# Patient Record
Sex: Male | Born: 1975 | Race: Black or African American | Hispanic: No | Marital: Married | Smoking: Never smoker
Health system: Southern US, Community
[De-identification: ages and names within clinical notes are randomized; demographics above are authoritative.]

## PROBLEM LIST (undated history)

## (undated) DIAGNOSIS — I10 Essential (primary) hypertension: Secondary | ICD-10-CM

## (undated) DIAGNOSIS — T7840XA Allergy, unspecified, initial encounter: Secondary | ICD-10-CM

## (undated) DIAGNOSIS — R079 Chest pain, unspecified: Secondary | ICD-10-CM

## (undated) DIAGNOSIS — F419 Anxiety disorder, unspecified: Secondary | ICD-10-CM

## (undated) DIAGNOSIS — R0789 Other chest pain: Secondary | ICD-10-CM

## (undated) HISTORY — PX: OTHER SURGICAL HISTORY: SHX169

## (undated) HISTORY — DX: Anxiety disorder, unspecified: F41.9

## (undated) HISTORY — DX: Allergy, unspecified, initial encounter: T78.40XA

## (undated) HISTORY — DX: Chest pain, unspecified: R07.9

---
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
## 1999-04-25 ENCOUNTER — Emergency Department (HOSPITAL_COMMUNITY): Admission: EM | Admit: 1999-04-25 | Discharge: 1999-04-25 | Payer: Self-pay | Admitting: Emergency Medicine

## 2002-10-27 ENCOUNTER — Emergency Department (HOSPITAL_COMMUNITY): Admission: EM | Admit: 2002-10-27 | Discharge: 2002-10-27 | Payer: Self-pay | Admitting: Emergency Medicine

## 2002-11-01 ENCOUNTER — Emergency Department (HOSPITAL_COMMUNITY): Admission: EM | Admit: 2002-11-01 | Discharge: 2002-11-01 | Payer: Self-pay | Admitting: Emergency Medicine

## 2003-01-05 ENCOUNTER — Observation Stay (HOSPITAL_COMMUNITY): Admission: EM | Admit: 2003-01-05 | Discharge: 2003-01-06 | Payer: Self-pay | Admitting: Emergency Medicine

## 2003-01-05 ENCOUNTER — Encounter: Payer: Self-pay | Admitting: Emergency Medicine

## 2005-03-03 ENCOUNTER — Emergency Department (HOSPITAL_COMMUNITY): Admission: EM | Admit: 2005-03-03 | Discharge: 2005-03-04 | Payer: Self-pay | Admitting: Emergency Medicine

## 2008-01-17 ENCOUNTER — Emergency Department (HOSPITAL_COMMUNITY): Admission: EM | Admit: 2008-01-17 | Discharge: 2008-01-17 | Payer: Self-pay | Admitting: Emergency Medicine

## 2010-08-16 ENCOUNTER — Emergency Department (HOSPITAL_COMMUNITY)
Admission: EM | Admit: 2010-08-16 | Discharge: 2010-08-17 | Disposition: A | Discharge status: Home / Self Care | Payer: BC Managed Care – PPO | Attending: Emergency Medicine | Admitting: Emergency Medicine

## 2010-08-16 DIAGNOSIS — R209 Unspecified disturbances of skin sensation: Secondary | ICD-10-CM | POA: Insufficient documentation

## 2010-08-16 DIAGNOSIS — M25519 Pain in unspecified shoulder: Secondary | ICD-10-CM | POA: Insufficient documentation

## 2010-08-16 DIAGNOSIS — R0789 Other chest pain: Secondary | ICD-10-CM | POA: Insufficient documentation

## 2010-08-16 DIAGNOSIS — I4510 Unspecified right bundle-branch block: Secondary | ICD-10-CM | POA: Insufficient documentation

## 2010-08-17 ENCOUNTER — Emergency Department (HOSPITAL_COMMUNITY): Payer: BC Managed Care – PPO

## 2010-08-17 LAB — CBC
HCT: 41 % (ref 39.0–52.0)
MCV: 84.9 fL (ref 78.0–100.0)
Platelets: 317 10*3/uL (ref 150–400)
RBC: 4.83 MIL/uL (ref 4.22–5.81)
WBC: 6.3 10*3/uL (ref 4.0–10.5)

## 2010-08-17 LAB — POCT I-STAT, CHEM 8
Chloride: 102 mEq/L (ref 96–112)
Creatinine, Ser: 1.4 mg/dL (ref 0.4–1.5)
Glucose, Bld: 100 mg/dL — ABNORMAL HIGH (ref 70–99)
HCT: 44 % (ref 39.0–52.0)
Hemoglobin: 15 g/dL (ref 13.0–17.0)
Potassium: 4.1 mEq/L (ref 3.5–5.1)
Sodium: 141 mEq/L (ref 135–145)

## 2010-08-17 LAB — POCT CARDIAC MARKERS: Troponin i, poc: 0.06 ng/mL (ref 0.00–0.09)

## 2010-08-17 LAB — DIFFERENTIAL
Eosinophils Absolute: 0.2 10*3/uL (ref 0.0–0.7)
Lymphocytes Relative: 31 % (ref 12–46)
Lymphs Abs: 2 10*3/uL (ref 0.7–4.0)
Neutrophils Relative %: 57 % (ref 43–77)

## 2010-11-11 NOTE — H&P (Signed)
NAME:  Colin Ross                        ACCOUNT NO.:  [id number]   MEDICAL RECORD NO.:  [id number]                   PATIENT TYPE:  OBV   LOCATION:  1823                                 FACILITY:  [facility]   PHYSICIAN:  Darlin Priestly, M.D.             DATE OF BIRTH:  [birth year]   DATE OF ADMISSION:  01/05/2003  DATE OF DISCHARGE:                                HISTORY & PHYSICAL   Colin Ross is a 35 year old black man who was admitted to [facility] for further evaluation of chest pain.   The patient, who has no past history of cardiac disease, presented to the  emergency department with a three-week history of intermittent chest pain.  He has experienced two to three episodes a day for the last three weeks.  Episodes occur at random and appear not to be related to physician,  activity, meals, or respirations.  The chest discomfort is described as a  substernal pressure as well as a left axillary ache.  The discomfort does  not radiate elsewhere.  It is associated with mild dyspnea but no  diaphoresis or nausea.  Episodes resolve spontaneously.  There are no  exacerbating or ameliorating factors. Of note, the chest pain does not occur  with activity including basketball and weight lifting.  There has been no  change in the frequency, intensity, or pattern of the chest pain.  The  patient is asymptomatic at this time.   As noted, the patient has no past history of cardiac disease, including no  history of chest pain, myocardial infarction, coronary artery disease,  congestive heart failure, or arrhythmia.  There is no history of  hypertension, hyperlipidemia, diabetes mellitus, smoking, or family history  of early coronary artery disease.  The patient pursues and active lifestyle  as he is a high school basketball coach.  In addition, as noted, he lifts  weights.   The patient is single and lives alone.   ALLERGIES:  He reportedly is allergic to  PENICILLIN.   MEDICATIONS:  None.   FAMILY HISTORY:  Noncontributory.   REVIEW OF SYSTEMS:  Reveals no problems related to his head, eyes, ears,  nose, mouth, throat, lungs, gastrointestinal system, genitourinary system,  or extremities.  There is no history of neurologic or psychiatric disorder.  There is no history of fever, chills, or weight loss.   PHYSICAL EXAMINATION:  VITAL SIGNS:  Blood pressure 110/58, pulse 60 and  regular, respirations 20, temperature 98.4.  GENERAL:  The patient was a tall, thin, young, white man in no discomfort.  He is alert, oriented, appropriate, and responsive.  HEENT:  Head, eyes, nose, and mouth were unremarkable.  NECK: Without thyromegaly or adenopathy.  Carotid pulses were palpable  bilaterally without bruits.  CARDIAC:  Normal S1 and S2.  There was S3, S4, murmur, rub, or click.  Cardiac rhythm is regular.  CHEST:  No chest wall tenderness  was noted.  LUNGS:  Clear.  ABDOMEN:  Soft and nontender.  There was no mass, hepatosplenomegaly, bruit,  distention, rebound, guarding, or rigidity.  Bowel sounds were normal.  RECTAL/GENITAL:  Examinations not performed as they were not pertinent to  the reason for acute care hospitalization.  EXTREMITIES:  Without edema, deviation, or deformity.  Radial and dorsalis  pedal pulses were palpable bilaterally.  NEUROLOGIC:  Rescreening neurologic survey was unremarkable.   LABORATORY DATA:  Electrocardiogram was normal.   Chest radiograph report was pending at the time of this dictation.   Initial CK 152, CK-MB 0.9, relative index 0.6, troponin less than 0.01.  White count 7.3, hemoglobin 15.9, hematocrit 45.4.  Admitting studies were  pending at the time of this dictation.   IMPRESSION:  Atypical chest pain, rule out coronary artery disease.   PLAN:  1. Telemetry.  2. Serial cardiac enzymes.  3. Aspirin.  4. Nitroglycerin paste.  5. Fasting lipid profile.  6. Cardiac testing per Dr.  Jenne Campus.     Quita Skye. Waldon Reining, MD                   Darlin Priestly, M.D.    MSC/MEDQ  D:  01/05/2003  T:  01/05/2003  Job:  [id number]

## 2010-11-11 NOTE — Discharge Summary (Signed)
NAMEBELTON, PEPLINSKI                        ACCOUNT NO.:  [id number]   MEDICAL RECORD NO.:  [id number]                   PATIENT TYPE:  OBV   LOCATION:  2033                                 FACILITY:  [facility]   PHYSICIAN:  Cristy Hilts. Jacinto Halim, M.D.                  DATE OF BIRTH:  1975-12-04   DATE OF ADMISSION:  01/05/2003  DATE OF DISCHARGE:  01/06/2003                                 DISCHARGE SUMMARY   HISTORY:  Mr. Thelonious Kauffmann is a 35 year old African-American male patient  who came into the emergency room because of episodes of chest pain. He  states that for the last three weeks he has been having intermittent chest  pain symptoms. 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  He plays basketball and also lifts weights. This occurred starting about  three weeks ago, thus he stopped his exertional activity. The chest pain is  described as a tightness in his chest radiating to his left axilla with some  slight shortness of breath symptoms. They occur at any time. They can last  for 20 minutes at a time. The episode the preceded him coming to the  emergency room started actually with an episode of light-headedness. Then he  had the chest tightness, and then he had some nausea and vomiting and he was  concerned about this and thus came to the ER. He also states that lately he  has been having a tiredness and shortness of breath with walking up stairs.  He was seen by Dr. _______, who was on call for our service, admitted with  serial cardiac enzymes. These came back negative x3. On the morning of January 06, 2003 he was seen by Dr. Jacinto Halim. He had been given also nitro paste, this  was giving him a headache, this was discontinued. Dr. Jacinto Halim felt that he  could be discharged to have an outpatient Cardiolite. The patient stated  that he also had an episode of food poisoning two to three weeks earlier and  was having some reflux problems, thus he was placed on a PPI at the  time of  discharge. Dr. Jacinto Halim had a long discussion with his parents and the patient  himself. They were upset because they were told that he may possibly need a  cardiac catheterization. The patient and the family did not want this done.  On the day of discharge his blood pressure was 140/82, his heart rate was  86, his respirations were 18, and his temperature was 98.   LABORATORY DATA:  His labs showed that his CK-MB were negative x3. Troponin  negative x3. His hemoglobin was 15.9, hematocrit was 45.4, WBCs were 7.3.  His platelets were 282. His D. dimer was 0.22. His sodium on the day of  discharge was 137, potassium 4.2, chloride 106, CO2 28. His glucose was 113,  BUN 12, creatinine 1.3.  Chest x-ray showed no active chest disease. His EKG  showed normal sinus rhythm, no acute abnormality.   DISCHARGE DIAGNOSES:  1. Chest pain, no myocardial infarction.  2. Low cardiac risk factor profile with maternal grandfather having a     myocardial infarction at age 32. Otherwise negative. No hypertension, no     tobacco use, no diabetes mellitus. Unknown cholesterol.     Lezlie Octave, N.P.                        Cristy Hilts. Jacinto Halim, M.D.    BB/MEDQ  D:  01/06/2003  T:  01/07/2003  Job:  [id number]

## 2011-03-24 LAB — DIFFERENTIAL
Basophils Absolute: 0
Basophils Relative: 0
Eosinophils Absolute: 0.2
Eosinophils Relative: 3
Lymphocytes Relative: 26
Lymphs Abs: 1.3
Monocytes Absolute: 0.2
Monocytes Relative: 5
Neutro Abs: 3.3
Neutrophils Relative %: 66

## 2011-03-24 LAB — POCT I-STAT, CHEM 8
BUN: 13
Calcium, Ion: 1 — ABNORMAL LOW
Creatinine, Ser: 1.5
Glucose, Bld: 111 — ABNORMAL HIGH
TCO2: 25

## 2011-03-24 LAB — CBC
HCT: 47.4
Hemoglobin: 15.8
MCHC: 33.3
MCV: 92.1
Platelets: 291
RBC: 5.14
RDW: 13.1
WBC: 5

## 2011-03-24 LAB — RAPID URINE DRUG SCREEN, HOSP PERFORMED
Amphetamines: NOT DETECTED
Benzodiazepines: NOT DETECTED
Tetrahydrocannabinol: NOT DETECTED

## 2014-01-23 ENCOUNTER — Other Ambulatory Visit: Payer: Self-pay | Admitting: Pediatrics

## 2014-03-12 ENCOUNTER — Ambulatory Visit (INDEPENDENT_AMBULATORY_CARE_PROVIDER_SITE_OTHER): Payer: BC Managed Care – PPO

## 2014-03-12 ENCOUNTER — Ambulatory Visit (INDEPENDENT_AMBULATORY_CARE_PROVIDER_SITE_OTHER): Payer: BC Managed Care – PPO | Admitting: Family Medicine

## 2014-03-12 ENCOUNTER — Telehealth: Payer: Self-pay | Admitting: Radiology

## 2014-03-12 VITALS — BP 108/72 | HR 86 | Temp 97.7°F | Resp 18 | Ht 76.0 in | Wt 190.8 lb

## 2014-03-12 DIAGNOSIS — R29898 Other symptoms and signs involving the musculoskeletal system: Secondary | ICD-10-CM

## 2014-03-12 DIAGNOSIS — R0602 Shortness of breath: Secondary | ICD-10-CM

## 2014-03-12 DIAGNOSIS — R209 Unspecified disturbances of skin sensation: Secondary | ICD-10-CM

## 2014-03-12 DIAGNOSIS — Z13220 Encounter for screening for lipoid disorders: Secondary | ICD-10-CM

## 2014-03-12 DIAGNOSIS — R079 Chest pain, unspecified: Secondary | ICD-10-CM

## 2014-03-12 DIAGNOSIS — R208 Other disturbances of skin sensation: Secondary | ICD-10-CM

## 2014-03-12 DIAGNOSIS — R202 Paresthesia of skin: Secondary | ICD-10-CM

## 2014-03-12 LAB — POCT CBC
GRANULOCYTE PERCENT: 61.1 % (ref 37–80)
HCT, POC: 49.3 % (ref 43.5–53.7)
Hemoglobin: 16.1 g/dL (ref 14.1–18.1)
Lymph, poc: 2 (ref 0.6–3.4)
MCH: 29.3 pg (ref 27–31.2)
MCHC: 32.6 g/dL (ref 31.8–35.4)
MCV: 89.7 fL (ref 80–97)
MID (CBC): 0.6 (ref 0–0.9)
MPV: 7.1 fL (ref 0–99.8)
PLATELET COUNT, POC: 307 10*3/uL (ref 142–424)
POC GRANULOCYTE: 4 (ref 2–6.9)
POC LYMPH PERCENT: 30.4 %L (ref 10–50)
POC MID %: 8.5 % (ref 0–12)
RBC: 5.5 M/uL (ref 4.69–6.13)
RDW, POC: 13.5 %
WBC: 6.6 10*3/uL (ref 4.6–10.2)

## 2014-03-12 LAB — D-DIMER, QUANTITATIVE (NOT AT ARMC): D DIMER QUANT: 0.27 ug{FEU}/mL (ref 0.00–0.48)

## 2014-03-12 NOTE — Telephone Encounter (Signed)
[facility] called with d dimer results of 0.27 (normal).  Dr Neva Seat informed.   Attempted to contact patient on home/mobile number listed, no answer, voicemail full.  Please try again later.

## 2014-03-12 NOTE — Progress Notes (Signed)
Subjective:    Patient ID: Colin Ross, male    DOB: 11/24/1975, 38 y.o.   MRN: [id number]  HPI Colin Ross is a 38 y.o. male Pcp:No PCP Per Patient  Presented for physical initially, but concerns on problem list to be addressed today with plan of return for scheduled physical  - not fasting today.   Chest pain, L sided - noticed a few days ago - outside of left chest - occasional squeeze sensation. Notices when still, not walking, but has not exerted himself in past few days. Also feels some heaviness in L shoulder and into upper L arm. Seems to be associated with left chest pain.  Not severe, just doesn't feel right. NKI, no new activity or exercises, no neck pain. No fever. Occasionally short of breath if carrying 81 yo son or going up stairs, but noticed that past few years. No known personal or family history of heart disease. Slight nausea- off and on for 1 week  No vomiting. No sweating.   Left calf tingling - past few days.  No calf pain, no swelling. Traveled on plane to [facility] about 1 week ago - 5 hours flight time. Has noticed similar symptoms in past, along with chest symptoms - thought to be due to anxiety. No recent stress testing  - ? Had normal stress test in 20's - unknown reason for this test. No history of blood clots. No illicit drug use.   Some increased stress with work over past month or month and a half. Denies depression.   Tx: asa - helps chest symptoms. No supplements or other medicines.   There are no active problems to display for this patient.  Past Medical History  Diagnosis Date  . Anxiety    History reviewed. No pertinent past surgical history. Allergies  Allergen Reactions  . Penicillins Rash   Prior to Admission medications   Not on File   History   Social History  . Marital Status: Married    Spouse Name: N/A    Number of Children: N/A  . Years of Education: N/A   Occupational History  . Not on file.   Social History Main  Topics  . Smoking status: Never Smoker   . Smokeless tobacco: Not on file  . Alcohol Use: No  . Drug Use: No  . Sexual Activity: Yes    Partners: Female   Other Topics Concern  . Not on file   Social History Narrative  . No narrative on file    Review of Systems  Constitutional: Negative for fever and chills.  Respiratory: Positive for shortness of breath.   Cardiovascular: Positive for chest pain. Negative for palpitations and leg swelling.  Gastrointestinal: Positive for nausea. Negative for vomiting and abdominal pain.  Musculoskeletal: Negative for joint swelling.  Skin: Negative for rash.       Objective:   Physical Exam  Vitals reviewed. Constitutional: He is oriented to person, place, and time. He appears well-developed and well-nourished.  HENT:  Head: Normocephalic and atraumatic.  Eyes: EOM are normal. Pupils are equal, round, and reactive to light.  Neck: No JVD present. Carotid bruit is not present.  Cardiovascular: Normal rate, regular rhythm, normal heart sounds and normal pulses.   No extrasystoles are present.  No murmur heard.  No systolic murmur is present  Unable to reproduce pain on exam and no reproduction of pain in arm with rom or neck rom.   Pulmonary/Chest: Effort normal and breath  sounds normal. No respiratory distress. He has no rales.  Musculoskeletal: He exhibits no edema.       Left lower leg: Normal. He exhibits no tenderness and no swelling (calf circumference equal -37cm at 15cm below patella. ).  Neurological: He is alert and oriented to person, place, and time.  Skin: Skin is warm and dry.  Psychiatric: He has a normal mood and affect. His behavior is normal.   Filed Vitals:   03/12/14 1301  BP: 108/72  Pulse: 86  Temp: 97.7 F (36.5 C)  TempSrc: Oral  Resp: 18  Height:  (1.93 m)  Weight: 190 lb 12.8 oz (86.546 kg)  SpO2: 98%    Visual Acuity Screening   Right eye Left eye Both eyes  Without correction: 20/20  With correction:      EKG: sr, early repol vs nonspecific st change anterolateral leads.  Results for orders placed in visit on 03/12/14  POCT CBC      Result Value Ref Range   WBC 6.6  4.6 - 10.2 K/uL   Lymph, poc 2.0  0.6 - 3.4   POC LYMPH PERCENT 30.4  10 - 50 %L   MID (cbc) 0.6  0 - 0.9   POC MID % 8.5  0 - 12 %M   POC Granulocyte 4.0  2 - 6.9   Granulocyte percent 61.1  37 - 80 %G   RBC 5.50  4.69 - 6.13 M/uL   Hemoglobin 16.1  14.1 - 18.1 g/dL   HCT, POC 16.1  09.6 - 53.7 %   MCV 89.7  80 - 97 fL   MCH, POC 29.3  27 - 31.2 pg   MCHC 32.6  31.8 - 35.4 g/dL   RDW, POC 04.5     Platelet Count, POC 307  142 - 424 K/uL   MPV 7.1  0 - 99.8 fL   CXR: [facility] reading (PRIMARY) by  Dr. Neva Seat: NAD, no apparent infiltrate or cardiomegaly.     Assessment & Plan:  [name] is a 38 y.o. male Chest pain, unspecified chest pain type, SOB (shortness of breath), Arm heaviness, Paresthesia/Tingling in extremity - L arm.  - Plan: D-dimer, quantitative, Ambulatory referral to Cardiology  - D dimer sent - negative at 0.27. Less likely PE.  No acute findings on EKG - early repol likely. No known cardiac risk factors, but by nature of symptoms - will have evaluated by cardiologist. Unable to reproduce sx's on cervial or shoulder exam or with chest wall palpation.  Advised to avoid exertional activities until seen by cardiology and 911/ER chest pain precautions discussed. Will check lipid panel to screen for hyperlipidemia for cardiac risk stratification.   -start ASA  qd.   Deferred physical with above concern, and not fasting.  Will plan on CPE next 1-2 months.    No orders of the defined types were placed in this encounter.   Patient Instructions  We will call you today if the blood clot test is elevated. Aspirin  each day.  Fasting lab visit only in next 4 days if possible.  Avoid exertion until evaluated by cardiologist- we will refer you there. If any increase or  worsening of chest symptoms - go to emergency room.  Follow up in next month or two for physical as discussed.  Return to the clinic or go to the nearest emergency room if any of your symptoms worsen or new symptoms occur.  Chest  Pain (Nonspecific) It is often hard to give a specific diagnosis for the cause of chest pain. There is always a chance that your pain could be related to something serious, such as a heart attack or a blood clot in the lungs. You need to follow up with your health care provider for further evaluation. CAUSES   Heartburn.  Pneumonia or bronchitis.  Anxiety or stress.  Inflammation around your heart (pericarditis) or lung (pleuritis or pleurisy).  A blood clot in the lung.  A collapsed lung (pneumothorax). It can develop suddenly on its own (spontaneous pneumothorax) or from trauma to the chest.  Shingles infection (herpes zoster virus). The chest wall is composed of bones, muscles, and cartilage. Any of these can be the source of the pain.  The bones can be bruised by injury.  The muscles or cartilage can be strained by coughing or overwork.  The cartilage can be affected by inflammation and become sore (costochondritis). DIAGNOSIS  Lab tests or other studies may be needed to find the cause of your pain. Your health care provider may have you take a test called an ambulatory electrocardiogram (ECG). An ECG records your heartbeat patterns over a 24-hour period. You may also have other tests, such as:  Transthoracic echocardiogram (TTE). During echocardiography, sound waves are used to evaluate how blood flows through your heart.  Transesophageal echocardiogram (TEE).  Cardiac monitoring. This allows your health care provider to monitor your heart rate and rhythm in real time.  Holter monitor. This is a portable device that records your heartbeat and can help diagnose heart arrhythmias. It allows your health care provider to track your heart activity for  several days, if needed.  Stress tests by exercise or by giving medicine that makes the heart beat faster. TREATMENT   Treatment depends on what may be causing your chest pain. Treatment may include:  Acid blockers for heartburn.  Anti-inflammatory medicine.  Pain medicine for inflammatory conditions.  Antibiotics if an infection is present.  You may be advised to change lifestyle habits. This includes stopping smoking and avoiding alcohol, caffeine, and chocolate.  You may be advised to keep your head raised (elevated) when sleeping. This reduces the chance of acid going backward from your stomach into your esophagus. Most of the time, nonspecific chest pain will improve within 2-3 days with rest and mild pain medicine.  HOME CARE INSTRUCTIONS   If antibiotics were prescribed, take them as directed. Finish them even if you start to feel better.  For the next few days, avoid physical activities that bring on chest pain. Continue physical activities as directed.  Do not use any tobacco products, including cigarettes, chewing tobacco, or electronic cigarettes.  Avoid drinking alcohol.  Only take medicine as directed by your health care provider.  Follow your health care provider's suggestions for further testing if your chest pain does not go away.  Keep any follow-up appointments you made. If you do not go to an appointment, you could develop lasting (chronic) problems with pain. If there is any problem keeping an appointment, call to reschedule. SEEK MEDICAL CARE IF:   Your chest pain does not go away, even after treatment.  You have a rash with blisters on your chest.  You have a fever. SEEK IMMEDIATE MEDICAL CARE IF:   You have increased chest pain or pain that spreads to your arm, neck, jaw, back, or abdomen.  You have shortness of breath.  You have an increasing cough, or you cough up  blood.  You have severe back or abdominal pain.  You feel nauseous or  vomit.  You have severe weakness.  You faint.  You have chills. This is an emergency. Do not wait to see if the pain will go away. Get medical help at once. Call your local emergency services (911 in [place] Do not drive yourself to the hospital. MAKE SURE YOU:   Understand these instructions.  Will watch your condition.  Will get help right away if you are not doing well or get worse. Document Released: 03/22/2005 Document Revised: 06/17/2013 Document Reviewed: 01/16/2008 [facility] Patient Information 2015 [place], [place]. This information is not intended to replace advice given to you by your health care provider. Make sure you discuss any questions you have with your health care provider.

## 2014-03-12 NOTE — Patient Instructions (Signed)
We will call you today if the blood clot test is elevated. Aspirin  each day.  Fasting lab visit only in next 4 days if possible.  Avoid exertion until evaluated by cardiologist- we will refer you there. If any increase or worsening of chest symptoms - go to emergency room.  Follow up in next month or two for physical as discussed.  Return to the clinic or go to the nearest emergency room if any of your symptoms worsen or new symptoms occur.  Chest Pain (Nonspecific) It is often hard to give a specific diagnosis for the cause of chest pain. There is always a chance that your pain could be related to something serious, such as a heart attack or a blood clot in the lungs. You need to follow up with your health care provider for further evaluation. CAUSES   Heartburn.  Pneumonia or bronchitis.  Anxiety or stress.  Inflammation around your heart (pericarditis) or lung (pleuritis or pleurisy).  A blood clot in the lung.  A collapsed lung (pneumothorax). It can develop suddenly on its own (spontaneous pneumothorax) or from trauma to the chest.  Shingles infection (herpes zoster virus). The chest wall is composed of bones, muscles, and cartilage. Any of these can be the source of the pain.  The bones can be bruised by injury.  The muscles or cartilage can be strained by coughing or overwork.  The cartilage can be affected by inflammation and become sore (costochondritis). DIAGNOSIS  Lab tests or other studies may be needed to find the cause of your pain. Your health care provider may have you take a test called an ambulatory electrocardiogram (ECG). An ECG records your heartbeat patterns over a 24-hour period. You may also have other tests, such as:  Transthoracic echocardiogram (TTE). During echocardiography, sound waves are used to evaluate how blood flows through your heart.  Transesophageal echocardiogram (TEE).  Cardiac monitoring. This allows your health care provider to  monitor your heart rate and rhythm in real time.  Holter monitor. This is a portable device that records your heartbeat and can help diagnose heart arrhythmias. It allows your health care provider to track your heart activity for several days, if needed.  Stress tests by exercise or by giving medicine that makes the heart beat faster. TREATMENT   Treatment depends on what may be causing your chest pain. Treatment may include:  Acid blockers for heartburn.  Anti-inflammatory medicine.  Pain medicine for inflammatory conditions.  Antibiotics if an infection is present.  You may be advised to change lifestyle habits. This includes stopping smoking and avoiding alcohol, caffeine, and chocolate.  You may be advised to keep your head raised (elevated) when sleeping. This reduces the chance of acid going backward from your stomach into your esophagus. Most of the time, nonspecific chest pain will improve within 2-3 days with rest and mild pain medicine.  HOME CARE INSTRUCTIONS   If antibiotics were prescribed, take them as directed. Finish them even if you start to feel better.  For the next few days, avoid physical activities that bring on chest pain. Continue physical activities as directed.  Do not use any tobacco products, including cigarettes, chewing tobacco, or electronic cigarettes.  Avoid drinking alcohol.  Only take medicine as directed by your health care provider.  Follow your health care provider's suggestions for further testing if your chest pain does not go away.  Keep any follow-up appointments you made. If you do not go to an appointment, you could  develop lasting (chronic) problems with pain. If there is any problem keeping an appointment, call to reschedule. SEEK MEDICAL CARE IF:   Your chest pain does not go away, even after treatment.  You have a rash with blisters on your chest.  You have a fever. SEEK IMMEDIATE MEDICAL CARE IF:   You have increased chest  pain or pain that spreads to your arm, neck, jaw, back, or abdomen.  You have shortness of breath.  You have an increasing cough, or you cough up blood.  You have severe back or abdominal pain.  You feel nauseous or vomit.  You have severe weakness.  You faint.  You have chills. This is an emergency. Do not wait to see if the pain will go away. Get medical help at once. Call your local emergency services (911 in [place] Do not drive yourself to the hospital. MAKE SURE YOU:   Understand these instructions.  Will watch your condition.  Will get help right away if you are not doing well or get worse. Document Released: 03/22/2005 Document Revised: 06/17/2013 Document Reviewed: 01/16/2008 [facility] Patient Information 2015 [place], [place]. This information is not intended to replace advice given to you by your health care provider. Make sure you discuss any questions you have with your health care provider.

## 2014-03-12 NOTE — Telephone Encounter (Signed)
Referrals- can you please

## 2014-04-16 ENCOUNTER — Encounter: Payer: Self-pay | Admitting: *Deleted

## 2014-04-20 ENCOUNTER — Encounter: Payer: Self-pay | Admitting: Family Medicine

## 2014-10-25 ENCOUNTER — Ambulatory Visit (INDEPENDENT_AMBULATORY_CARE_PROVIDER_SITE_OTHER): Payer: BC Managed Care – PPO | Admitting: Physician Assistant

## 2014-10-25 VITALS — BP 120/78 | HR 80 | Temp 97.7°F | Ht 76.8 in | Wt 199.0 lb

## 2014-10-25 DIAGNOSIS — J309 Allergic rhinitis, unspecified: Secondary | ICD-10-CM

## 2014-10-25 DIAGNOSIS — H1012 Acute atopic conjunctivitis, left eye: Secondary | ICD-10-CM | POA: Diagnosis not present

## 2014-10-25 MED ORDER — OLOPATADINE HCL 0.1 % OP SOLN
1.0000 [drp] | Freq: Two times a day (BID) | OPHTHALMIC | Status: DC
Start: 2014-10-25 — End: 2019-01-08

## 2014-10-25 MED ORDER — FLUTICASONE PROPIONATE 50 MCG/ACT NA SUSP: 2.0000 | Freq: Every day | NASAL | Status: DC

## 2014-10-25 NOTE — Patient Instructions (Signed)
Allergic Conjunctivitis  The conjunctiva is a thin membrane that covers the visible white part of the eyeball and the underside of the eyelids. This membrane protects and lubricates the eye. The membrane has small blood vessels running through it that can normally be seen. When the conjunctiva becomes inflamed, the condition is called conjunctivitis. In response to the inflammation, the conjunctival blood vessels become swollen. The swelling results in redness in the normally white part of the eye.  The blood vessels of this membrane also react when a person has allergies and is then called allergic conjunctivitis. This condition usually lasts for as long as the allergy persists. Allergic conjunctivitis cannot be passed to another person (non-contagious). The likelihood of bacterial infection is great and the cause is not likely due to allergies if the inflamed eye has:  · A sticky discharge.  · Discharge or sticking together of the lids in the morning.  · Scaling or flaking of the eyelids where the eyelashes come out.  · Red swollen eyelids.  CAUSES   · Viruses.  · Irritants such as foreign bodies.  · Chemicals.  · General allergic reactions.  · Inflammation or serious diseases in the inside or the outside of the eye or the orbit (the boney cavity in which the eye sits) can cause a "red eye."  SYMPTOMS   · Eye redness.  · Tearing.  · Itchy eyes.  · Burning feeling in the eyes.  · Clear drainage from the eye.  · Allergic reaction due to pollens or ragweed sensitivity. Seasonal allergic conjunctivitis is frequent in the spring when pollens are in the air and in the fall.  DIAGNOSIS   This condition, in its many forms, is usually diagnosed based on the history and an ophthalmological exam. It usually involves both eyes. If your eyes react at the same time every year, allergies may be the cause. While most "red eyes" are due to allergy or an infection, the role of an eye (ophthalmological) exam is important. The exam  can rule out serious diseases of the eye or orbit.  TREATMENT   · Non-antibiotic eye drops, ointments, or medications by mouth may be prescribed if the ophthalmologist is sure the conjunctivitis is due to allergies alone.  · Over-the-counter drops and ointments for allergic symptoms should be used only after other causes of conjunctivitis have been ruled out, or as your caregiver suggests.  Medications by mouth are often prescribed if other allergy-related symptoms are present. If the ophthalmologist is sure that the conjunctivitis is due to allergies alone, treatment is normally limited to drops or ointments to reduce itching and burning.  HOME CARE INSTRUCTIONS   · Wash hands before and after applying drops or ointments, or touching the inflamed eye(s) or eyelids.  · Do not let the eye dropper tip or ointment tube touch the eyelid when putting medicine in your eye.  · Stop using your soft contact lenses and throw them away. Use a new pair of lenses when recovery is complete. You should run through sterilizing cycles at least three times before use after complete recovery if the old soft contact lenses are to be used. Hard contact lenses should be stopped. They need to be thoroughly sterilized before use after recovery.  · Itching and burning eyes due to allergies is often relieved by using a cool cloth applied to closed eye(s).  SEEK MEDICAL CARE IF:   · Your problems do not go away after two or three days of treatment.  ·   Your lids are sticky (especially in the morning when you wake up) or stick together.  · Discharge develops. Antibiotics may be needed either as drops, ointment, or by mouth.  · You have extreme light sensitivity.  · An oral temperature above 102° F (38.9° C) develops.  · Pain in or around the eye or any other visual symptom develops.  MAKE SURE YOU:   · Understand these instructions.  · Will watch your condition.  · Will get help right away if you are not doing well or get worse.  Document  Released: 09/02/2002 Document Revised: 09/04/2011 Document Reviewed: 07/29/2007  ExitCare® Patient Information ©2015 [employer]. This information is not intended to replace advice given to you by your health care provider. Make sure you discuss any questions you have with your health care provider.

## 2014-10-25 NOTE — Progress Notes (Signed)
   Subjective:    Patient ID: Colin CoveDarren M Hufstedler, male    DOB: 21-Apr-1976, 39 y.o.   MRN: [id number]  HPI Patient presents for eye irritation that started last night. Eye started itching last night and he rubbed it a lot. When he woke up this morning, upper lid was swollen and eye was red. Eye lashes had a lot of white crust this morning and eyes have been watery all day. Eye is still itchy, but denies pain. Right eye was similar yesterday, but redness has improved, but is still itchy. Additionally endorses congestion, rhinorrhea, itchy nose/throat, and sneezing. Denies cough, fever, sore throat, N/V, change in vision, wheezing, SOB/CP, HA, or dizziness. H/o allergies which have been worse this season, but no asthma. Med allergy to PCN.    Review of Systems As noted above.     Objective:   Physical Exam  Constitutional: He is oriented to person, place, and time. He appears well-developed and well-nourished. No distress.  Blood pressure 120/78, pulse 80, temperature 97.7 F (36.5 C), temperature source Oral, height 6' 4.8" (1.951 m), weight 199 lb (90.266 kg), SpO2 97 %.  HENT:  Head: Normocephalic and atraumatic.  Right Ear: External ear normal.  Left Ear: External ear normal.  Mouth/Throat: Oropharynx is clear and moist. No oropharyngeal exudate.  Eyes: Pupils are equal, round, and reactive to light. Right eye exhibits discharge (watery). Left eye exhibits discharge (watery). No scleral icterus.  Neck: Normal range of motion. Neck supple. No thyromegaly present.  Pulmonary/Chest: Effort normal.  Lymphadenopathy:    He has no cervical adenopathy.  Neurological: He is alert and oriented to person, place, and time.  Skin: Skin is warm and dry. No rash noted. He is not diaphoretic. No erythema. No pallor.      Assessment & Plan:  1. Allergic conjunctivitis, left - olopatadine (PATANOL) 0.1 % ophthalmic solution; Place 1 drop into the left eye 2 (two) times daily.  Dispense: 5 mL; Refill:  1  2. Allergic rhinitis, unspecified allergic rhinitis type - fluticasone (FLONASE) 50 MCG/ACT nasal spray; Place 2 sprays into both nostrils daily.  Dispense: 16 g; Refill: 12   Sharell Hilmer PA-C  [facility] 10/25/2014 1:01 PM

## 2015-03-13 ENCOUNTER — Ambulatory Visit (INDEPENDENT_AMBULATORY_CARE_PROVIDER_SITE_OTHER): Payer: BC Managed Care – PPO | Admitting: Emergency Medicine

## 2015-03-13 VITALS — BP 100/64 | HR 66 | Temp 98.3°F | Resp 16 | Ht 76.0 in | Wt 200.6 lb

## 2015-03-13 DIAGNOSIS — Z0000 Encounter for general adult medical examination without abnormal findings: Secondary | ICD-10-CM

## 2015-03-13 NOTE — Progress Notes (Signed)

## 2015-03-13 NOTE — Progress Notes (Signed)
Subjective:  Patient ID: Colin Ross, male    DOB: May 27, 1976  Age: 39 y.o. MRN: [id number]  CC: CPE   HPI Colin Ross presents  annual physical examination and a TB skin test.  History Colin Ross has a past medical history of Anxiety; Chest pain; and Allergy.   He has no past surgical history on file.   His  family history includes Cancer in his mother; Diabetes in his father; Hypertension in his father and mother.  He   reports that he has never smoked. He does not have any smokeless tobacco history on file. He reports that he does not drink alcohol or use illicit drugs.  Outpatient Prescriptions Prior to Visit  Medication Sig Dispense Refill  . fluticasone (FLONASE) 50 MCG/ACT nasal spray Place 2 sprays into both nostrils daily. (Patient not taking: Reported on 03/13/2015) 16 g 12  . olopatadine (PATANOL) 0.1 % ophthalmic solution Place 1 drop into the left eye 2 (two) times daily. (Patient not taking: Reported on 03/13/2015) 5 mL 1   No facility-administered medications prior to visit.    Social History   Social History  . Marital Status: Married    Spouse Name: N/A  . Number of Children: N/A  . Years of Education: N/A   Social History Main Topics  . Smoking status: Never Smoker   . Smokeless tobacco: None  . Alcohol Use: No  . Drug Use: No  . Sexual Activity:    Partners: Female   Other Topics Concern  . None   Social History Narrative     Review of Systems  Constitutional: Negative for fever, chills and appetite change.  HENT: Negative for congestion, ear pain, postnasal drip, sinus pressure and sore throat.   Eyes: Negative for pain and redness.  Respiratory: Negative for cough, shortness of breath and wheezing.   Cardiovascular: Negative for leg swelling.  Gastrointestinal: Negative for nausea, vomiting, abdominal pain, diarrhea, constipation and blood in stool.  Endocrine: Negative for polyuria.  Genitourinary: Negative for dysuria, urgency,  frequency and flank pain.  Musculoskeletal: Negative for gait problem.  Skin: Negative for rash.  Neurological: Negative for weakness and headaches.  Psychiatric/Behavioral: Negative for confusion and decreased concentration. The patient is not nervous/anxious.     Objective:  BP 100/64 mmHg  Pulse 66  Temp(Src) 98.3 F (36.8 C) (Oral)  Resp 16  Ht 6\' 4"  (1.93 m)  Wt 200 lb 9.6 oz (90.992 kg)  BMI 24.43 kg/m2  SpO2 99%  Physical Exam  Constitutional: He is oriented to person, place, and time. He appears well-developed and well-nourished. No distress.  HENT:  Head: Normocephalic and atraumatic.  Right Ear: External ear normal.  Left Ear: External ear normal.  Nose: Nose normal.  Eyes: Conjunctivae and EOM are normal. Pupils are equal, round, and reactive to light. No scleral icterus.  Neck: Normal range of motion. Neck supple. No tracheal deviation present.  Cardiovascular: Normal rate, regular rhythm and normal heart sounds.   Pulmonary/Chest: Effort normal. No respiratory distress. He has no wheezes. He has no rales.  Abdominal: He exhibits no mass. There is no tenderness. There is no rebound and no guarding.  Musculoskeletal: He exhibits no edema.  Lymphadenopathy:    He has no cervical adenopathy.  Neurological: He is alert and oriented to person, place, and time. Coordination normal.  Skin: Skin is warm and dry. No rash noted.  Psychiatric: He has a normal mood and affect. His behavior is normal.  Assessment & Plan:   Knowledge was seen today for cpe.  Diagnoses and all orders for this visit:  Annual physical exam -     TB Skin Test  I am having Colin Ross maintain his olopatadine and fluticasone.  No orders of the defined types were placed in this encounter.    Appropriate red flag conditions were discussed with the patient as well as actions that should be taken.  Patient expressed his understanding.  Follow-up: Return in about 2 days (around  03/15/2015).  Carmelina Dane, MD

## 2015-03-13 NOTE — Patient Instructions (Signed)
Tuberculin Skin Test The PPD skin test is a method used to help with the diagnosis of a disease called tuberculosis (TB). HOW THE TEST IS DONE  The test site (usually the forearm) is cleansed. The PPD extract is then injected under the top layer of skin, causing a blister to form on the skin. The reaction will take 48 - 72 hours to develop. You must return to your health care Colin Ross within that time to have the area checked. This will determine whether you have had a significant reaction to the PPD test. A reaction is measured in millimeters of hard swelling (induration) at the site. PREPARATION FOR TEST  There is no special preparation for this test. People with a skin rash or other skin irritations on their arms may need to have the test performed at a different spot on the body. Tell your health care Colin Ross if you have ever had a positive PPD skin test. If so, you should not have a repeat PPD test. Tell your doctor if you have a medical condition or if you take certain drugs, such as steroids, that can affect your immune system. These situations may lead to inaccurate test results. NORMAL FINDINGS A negative reaction (no induration) or a level of hard swelling that falls below a certain cutoff may mean that a person has not been infected with the bacteria that cause TB. There are different cutoffs for children, people with HIV, and other risk groups. Unfortunately, this is not a perfect test, and up to 20% of people infected with tuberculosis may not have a reaction on the PPD skin test. In addition, certain conditions that affect the immune system (cancer, recent chemotherapy, late-stage AIDS) may cause a false-negative test result.  The reaction will take 48 - 72 hours to develop. You must return to your health care Colin Ross within that time to have the area checked. Follow your caregiver's instructions as to where and when to report for this to be done. Ranges for normal findings may vary  among different laboratories and hospitals. You should always check with your doctor after having lab work or other tests done to discuss the meaning of your test results and whether your values are considered within normal limits. WHAT ABNORMAL RESULTS MEAN  The results of the test depend on the size of the skin reaction and on the person being tested.  A small reaction (5 mm of hard swelling at the site) is considered to be positive in people who have HIV, who are taking steroid therapy, or who have been in close contact with a person who has active tuberculosis. Larger reactions (greater than or equal to 10 mm) are considered positive in people with diabetes or kidney failure, and in health care workers, among others. In people with no known risks for tuberculosis, a positive reaction requires 15 mm or more of hard swelling at the site. RISKS AND COMPLICATIONS There is a very small risk of severe redness and swelling of the arm in people who have had a previous positive PPD test and who have the test again. There also have been a few rare cases of this reaction in people who have not been tested before. CONSIDERATIONS  A positive skin test does not necessarily mean that a person has active tuberculosis. More tests will be done to check whether active disease is present. Many people who were born outside the [place] may have had a vaccine called "BCG," which can lead to a false-positive test   result. MEANING OF TEST  Your caregiver will go over the test results with you and discuss the importance and meaning of your results, as well as treatment options and the need for additional tests if necessary. OBTAINING THE TEST RESULTS It is your responsibility to obtain your test results. Ask the lab or department performing the test when and how you will get your results. Document Released: 03/22/2005 Document Revised: 09/04/2011 Document Reviewed: 09/19/2013 ExitCare Patient Information 2015  [employer]. This information is not intended to replace advice given to you by your health care Colin Ross. Make sure you discuss any questions you have with your health care Colin Ross.  

## 2015-03-15 ENCOUNTER — Ambulatory Visit (INDEPENDENT_AMBULATORY_CARE_PROVIDER_SITE_OTHER): Payer: BC Managed Care – PPO | Admitting: *Deleted

## 2015-03-15 DIAGNOSIS — Z111 Encounter for screening for respiratory tuberculosis: Secondary | ICD-10-CM

## 2015-03-15 LAB — TB SKIN TEST
Induration: 0 mm
TB Skin Test: NEGATIVE

## 2017-11-26 ENCOUNTER — Other Ambulatory Visit: Payer: Self-pay | Admitting: Gastroenterology

## 2017-11-26 DIAGNOSIS — R1084 Generalized abdominal pain: Secondary | ICD-10-CM

## 2017-11-26 DIAGNOSIS — R935 Abnormal findings on diagnostic imaging of other abdominal regions, including retroperitoneum: Secondary | ICD-10-CM

## 2017-11-28 ENCOUNTER — Other Ambulatory Visit: Payer: Self-pay

## 2017-11-28 DIAGNOSIS — R1012 Left upper quadrant pain: Secondary | ICD-10-CM

## 2017-12-07 ENCOUNTER — Ambulatory Visit
Admission: RE | Admit: 2017-12-07 | Discharge: 2017-12-07 | Disposition: A | Discharge status: Home / Self Care | Payer: BC Managed Care – PPO | Source: Ambulatory Visit | Attending: Gastroenterology | Admitting: Gastroenterology

## 2017-12-07 DIAGNOSIS — R935 Abnormal findings on diagnostic imaging of other abdominal regions, including retroperitoneum: Secondary | ICD-10-CM

## 2017-12-07 DIAGNOSIS — R1084 Generalized abdominal pain: Secondary | ICD-10-CM

## 2017-12-07 MED ORDER — IOPAMIDOL (ISOVUE-300) INJECTION 61%
125.0000 mL | Freq: Once | INTRAVENOUS | Status: AC | PRN
  Administered 2017-12-07: 125 mL via INTRAVENOUS

## 2018-12-17 ENCOUNTER — Emergency Department (HOSPITAL_BASED_OUTPATIENT_CLINIC_OR_DEPARTMENT_OTHER): Payer: BC Managed Care – PPO

## 2018-12-17 ENCOUNTER — Encounter (HOSPITAL_BASED_OUTPATIENT_CLINIC_OR_DEPARTMENT_OTHER): Payer: Self-pay | Admitting: Emergency Medicine

## 2018-12-17 ENCOUNTER — Emergency Department (HOSPITAL_BASED_OUTPATIENT_CLINIC_OR_DEPARTMENT_OTHER)
Admission: EM | Admit: 2018-12-17 | Discharge: 2018-12-17 | Disposition: A | Discharge status: Home / Self Care | Payer: BC Managed Care – PPO | Attending: Emergency Medicine | Admitting: Emergency Medicine

## 2018-12-17 ENCOUNTER — Other Ambulatory Visit: Payer: Self-pay

## 2018-12-17 DIAGNOSIS — R079 Chest pain, unspecified: Secondary | ICD-10-CM | POA: Diagnosis present

## 2018-12-17 DIAGNOSIS — R072 Precordial pain: Secondary | ICD-10-CM | POA: Diagnosis not present

## 2018-12-17 LAB — COMPREHENSIVE METABOLIC PANEL
ALT: 25 U/L (ref 0–44)
AST: 22 U/L (ref 15–41)
Albumin: 4.3 g/dL (ref 3.5–5.0)
Alkaline Phosphatase: 88 U/L (ref 38–126)
Anion gap: 9 (ref 5–15)
BUN: 13 mg/dL (ref 6–20)
CO2: 27 mmol/L (ref 22–32)
Calcium: 9.6 mg/dL (ref 8.9–10.3)
Chloride: 102 mmol/L (ref 98–111)
Creatinine, Ser: 1.26 mg/dL — ABNORMAL HIGH (ref 0.61–1.24)
GFR calc Af Amer: >60 mL/min (ref >60)
GFR calc non Af Amer: >60 mL/min (ref >60)
Glucose, Bld: 114 mg/dL — ABNORMAL HIGH (ref 70–99)
Potassium: 3.4 mmol/L — ABNORMAL LOW (ref 3.5–5.1)
Sodium: 138 mmol/L (ref 135–145)
Total Bilirubin: 0.8 mg/dL (ref 0.3–1.2)
Total Protein: 8.5 g/dL — ABNORMAL HIGH (ref 6.5–8.1)

## 2018-12-17 LAB — CBC WITH DIFFERENTIAL/PLATELET
Abs Immature Granulocytes: 0.02 10*3/uL (ref 0.00–0.07)
Basophils Absolute: 0 10*3/uL (ref 0.0–0.1)
Basophils Relative: 1 %
Eosinophils Absolute: 0.1 10*3/uL (ref 0.0–0.5)
Eosinophils Relative: 2 %
HCT: 48.5 % (ref 39.0–52.0)
Hemoglobin: 16.2 g/dL (ref 13.0–17.0)
Immature Granulocytes: 0 %
Lymphocytes Relative: 25 %
Lymphs Abs: 1.7 10*3/uL (ref 0.7–4.0)
MCH: 29.5 pg (ref 26.0–34.0)
MCHC: 33.4 g/dL (ref 30.0–36.0)
MCV: 88.2 fL (ref 80.0–100.0)
Monocytes Absolute: 0.6 10*3/uL (ref 0.1–1.0)
Monocytes Relative: 10 %
Neutro Abs: 4.3 10*3/uL (ref 1.7–7.7)
Neutrophils Relative %: 62 %
Platelets: 297 10*3/uL (ref 150–400)
RBC: 5.5 MIL/uL (ref 4.22–5.81)
RDW: 12.8 % (ref 11.5–15.5)
WBC: 6.7 10*3/uL (ref 4.0–10.5)
nRBC: 0 % (ref 0.0–0.2)

## 2018-12-17 LAB — TROPONIN I (HIGH SENSITIVITY)
Troponin I (High Sensitivity): <2 ng/L (ref <18)
Troponin I (High Sensitivity): <2 ng/L (ref <18)

## 2018-12-17 LAB — D-DIMER, QUANTITATIVE: D-Dimer, Quant: <0.27 ug/mL-FEU (ref 0.00–0.50)

## 2018-12-17 NOTE — ED Notes (Signed)
Pt on monitor 

## 2018-12-17 NOTE — Discharge Instructions (Addendum)
Make an appointment and follow-up closely with the cardiologist.  Return immediately for any worsening of your symptoms or concerns.

## 2018-12-17 NOTE — ED Notes (Signed)
Patient transported to X-ray 

## 2018-12-17 NOTE — ED Provider Notes (Signed)
[facility] Provider Note   CSN: [id number] Arrival date & time: 12/17/18  1810    History   Chief Complaint Chief Complaint  Patient presents with  . Shortness of Breath  . Chest Pain    HPI Colin Ross is a 43 y.o. male.     HPI Patient states that yesterday while playing basketball with his son he experienced central chest discomfort and shortness of breath.  States that the chest discomfort has been episodic since.  Currently claiming 1-2/10 pain.  Was seen in urgent care and had EKG performed which showed Q waves in inferior leads.  Was given 324 mg of aspirin and referred to the emergency department.  With no smoking history.  No family history of CAD.  No fevers or chills.  No cough or lower extremity swelling or pain. Past Medical History:  Diagnosis Date  . Allergy   . Anxiety   . Chest pain    negative for ischemia    There are no active problems to display for this patient.   History reviewed. No pertinent surgical history.      Home Medications    Prior to Admission medications   Medication Sig Start Date End Date Taking? Authorizing Provider  fluticasone (FLONASE) 50 MCG/ACT nasal spray Place 2 sprays into both nostrils daily. Patient not taking: Reported on 03/13/2015 10/25/14   Brewington, Tishira R, PA-C  olopatadine (PATANOL) 0.1 % ophthalmic solution Place 1 drop into the left eye 2 (two) times daily. Patient not taking: Reported on 03/13/2015 10/25/14   Brewington, Jake Seats, PA-C    Family History Family History  Problem Relation Age of Onset  . Cancer Mother   . Hypertension Mother   . Hypertension Father   . Diabetes Father     Social History Social History   Tobacco Use  . Smoking status: Never Smoker  . Smokeless tobacco: Never Used  Substance Use Topics  . Alcohol use: No    Alcohol/week: 0.0 standard drinks  . Drug use: No     Allergies   Penicillins   Review of Systems Review of  Systems  Constitutional: Negative for chills and fever.  Respiratory: Positive for shortness of breath. Negative for cough and wheezing.   Cardiovascular: Positive for chest pain. Negative for palpitations and leg swelling.  Gastrointestinal: Negative for abdominal pain, constipation, diarrhea, nausea and vomiting.  Musculoskeletal: Negative for back pain, myalgias and neck pain.  Skin: Negative for rash and wound.  Neurological: Negative for dizziness, weakness, light-headedness, numbness and headaches.  All other systems reviewed and are negative.    Physical Exam Updated Vital Signs BP 115/78   Pulse (!) 57   Temp 98.4 F (36.9 C) (Oral)   Resp 20   Ht 6\' 4"  (1.93 m)   Wt 95.3 kg   SpO2 100%   BMI 25.56 kg/m   Physical Exam Vitals signs and nursing note reviewed.  Constitutional:      General: He is not in acute distress.    Appearance: Normal appearance. He is well-developed. He is not ill-appearing.  HENT:     Head: Normocephalic and atraumatic.     Mouth/Throat:     Mouth: Mucous membranes are moist.  Eyes:     Extraocular Movements: Extraocular movements intact.     Pupils: Pupils are equal, round, and reactive to light.  Neck:     Musculoskeletal: Normal range of motion and neck supple.  Cardiovascular:  Rate and Rhythm: Normal rate and regular rhythm.     Heart sounds: No murmur. No friction rub. No gallop.   Pulmonary:     Effort: Pulmonary effort is normal. No respiratory distress.     Breath sounds: Normal breath sounds. No stridor. No wheezing, rhonchi or rales.  Chest:     Chest wall: No tenderness.  Abdominal:     General: Bowel sounds are normal.     Palpations: Abdomen is soft.     Tenderness: There is no abdominal tenderness. There is no guarding or rebound.  Musculoskeletal: Normal range of motion.        General: No swelling, tenderness, deformity or signs of injury.     Right lower leg: No edema.     Left lower leg: No edema.  Skin:     General: Skin is warm and dry.     Findings: No erythema or rash.  Neurological:     General: No focal deficit present.     Mental Status: He is alert and oriented to person, place, and time.  Psychiatric:        Behavior: Behavior normal.      ED Treatments / Results  Labs (all labs ordered are listed, but only abnormal results are displayed) Labs Reviewed  COMPREHENSIVE METABOLIC PANEL - Abnormal; Notable for the following components:      Result Value   Potassium 3.4 (*)    Glucose, Bld 114 (*)    Creatinine, Ser 1.26 (*)    Total Protein 8.5 (*)    All other components within normal limits  CBC WITH DIFFERENTIAL/PLATELET  D-DIMER, QUANTITATIVE ([facility])  TROPONIN I (HIGH SENSITIVITY)  TROPONIN I (HIGH SENSITIVITY)    EKG EKG Interpretation  Date/Time:  Tuesday December 17 2018 18:36:50 EDT Ventricular Rate:  67 PR Interval:    QRS Duration: 104 QT Interval:  375 QTC Calculation: 396 R Axis:   73 Text Interpretation:  Sinus rhythm Probable inferior infarct, old Lateral leads are also involved Confirmed by Loren RacerYelverton, Rigley Niess ([id number]) on 12/17/2018 10:00:35 PM   Radiology Dg Chest 2 View  Result Date: 12/17/2018 CLINICAL DATA:  Shortness of breath, mid to left-sided chest pain EXAM: CHEST - 2 VIEW COMPARISON:  03/12/2014 FINDINGS: The heart size and mediastinal contours are within normal limits. Both lungs are clear. The visualized skeletal structures are unremarkable. IMPRESSION: No acute abnormality of the lungs. Electronically Signed   By: Lauralyn PrimesAlex  Bibbey M.D.   On: 12/17/2018 20:04    Procedures Procedures (including critical care time)  Medications Ordered in ED Medications - No data to display   Initial Impression / Assessment and Plan / ED Course  I have reviewed the triage vital signs and the nursing notes.  Pertinent labs & imaging results that were available during my care of the patient were reviewed by me and considered in my medical decision making (see  chart for details).        EKG without significant changes from prior.  Patient had stress test several years ago which he said was normal.  No significant risk factors for MI.  High-sensitivity troponin x2 is undetectable.  Normal d-dimer.  Advised patient to follow-up with Dr. Jacinto HalimGanji and strict return precautions have been given. Final Clinical Impressions(s) / ED Diagnoses   Final diagnoses:  Precordial chest pain    ED Discharge Orders    None       Loren RacerYelverton, Dionta Larke, MD 12/17/18 2201

## 2018-12-17 NOTE — ED Triage Notes (Signed)
Pt sent here from [facility]; sts noticed [place] yest while playing basketball with son; chest "discomfort" started later on that evening; SHOB and CP intermittent since last pm

## 2019-01-08 ENCOUNTER — Encounter: Payer: Self-pay | Admitting: Cardiology

## 2019-01-10 ENCOUNTER — Encounter: Payer: Self-pay | Admitting: Cardiology

## 2019-01-10 ENCOUNTER — Other Ambulatory Visit: Payer: Self-pay

## 2019-01-10 ENCOUNTER — Ambulatory Visit (INDEPENDENT_AMBULATORY_CARE_PROVIDER_SITE_OTHER): Payer: BC Managed Care – PPO | Admitting: Cardiology

## 2019-01-10 VITALS — BP 126/82 | HR 89 | Temp 96.6°F | Ht 76.0 in | Wt 212.8 lb

## 2019-01-10 DIAGNOSIS — R9431 Abnormal electrocardiogram [ECG] [EKG]: Secondary | ICD-10-CM | POA: Diagnosis not present

## 2019-01-10 DIAGNOSIS — I209 Angina pectoris, unspecified: Secondary | ICD-10-CM

## 2019-01-10 DIAGNOSIS — R0609 Other forms of dyspnea: Secondary | ICD-10-CM

## 2019-01-10 MED ORDER — ASPIRIN EC 81 MG PO TBEC: 81.0000 mg | DELAYED_RELEASE_TABLET | Freq: Every day | ORAL | 3 refills | Status: DC

## 2019-01-10 MED ORDER — METOPROLOL SUCCINATE ER 25 MG PO TB24
25.0000 mg | ORAL_TABLET | Freq: Every day | ORAL | 1 refills | Status: DC
Start: 2019-01-10 — End: 2019-02-06

## 2019-01-10 NOTE — Progress Notes (Signed)
Primary Physician:  Patient, No Pcp Per   Patient ID: Colin Ross, male    DOB: 18-Oct-1975, 43 y.o.   MRN: [id number]  Subjective:    Chief Complaint  Patient presents with  . New Patient (Initial Visit)  . Hospitalization Follow-up  . Chest Pain    HPI: Colin Ross  is a 43 y.o. male  with no significant past medical history, presents for evaluation of chest pain.  Patient was last seen by [place] in 2015 for chest pain, underwent GXT that was normal. He recently was playing basketball with his son, and noted chest heaviness and shortness of breath. He has continued to have daily symptoms of chest heaviness that radiate to his left shoulder and back. Symptoms occur just at rest and can persist for several hours. He has not been active recently due to this. Also has dyspnea with minimal activity and increased fatigue.   He does not have a PCP. Denies any hypertension, hyperlipidemia, or diabetes. He states prior to this he would say he was in the best shape that he has been in in a long time.   No former tobacco or drug use. Occasional alcohol use.   Past Medical History:  Diagnosis Date  . Allergy   . Anxiety   . Chest pain    negative for ischemia    Past Surgical History:  Procedure Laterality Date  . none      Social History   Socioeconomic History  . Marital status: Married    Spouse name: Not on file  . Number of children: 2  . Years of education: Not on file  . Highest education level: Not on file  Occupational History  . Not on file  Social Needs  . Financial resource strain: Not on file  . Food insecurity    Worry: Not on file    Inability: Not on file  . Transportation needs    Medical: Not on file    Non-medical: Not on file  Tobacco Use  . Smoking status: Never Smoker  . Smokeless tobacco: Never Used  Substance and Sexual Activity  . Alcohol use: No    Alcohol/week: 0.0 standard drinks  . Drug use: No  . Sexual activity: Yes   Partners: Female  Lifestyle  . Physical activity    Days per week: Not on file    Minutes per session: Not on file  . Stress: Not on file  Relationships  . Social Herbalist on phone: Not on file    Gets together: Not on file    Attends religious service: Not on file    Active member of club or organization: Not on file    Attends meetings of clubs or organizations: Not on file    Relationship status: Not on file  . Intimate partner violence    Fear of current or ex partner: Not on file    Emotionally abused: Not on file    Physically abused: Not on file    Forced sexual activity: Not on file  Other Topics Concern  . Not on file  Social History Narrative  . Not on file    Review of Systems  Constitution: Positive for malaise/fatigue. Negative for decreased appetite, weight gain and weight loss.  Eyes: Negative for visual disturbance.  Cardiovascular: Positive for chest pain and dyspnea on exertion. Negative for claudication, leg swelling, orthopnea, palpitations and syncope.  Respiratory: Negative for hemoptysis and wheezing.  Endocrine: Negative for cold intolerance and heat intolerance.  Hematologic/Lymphatic: Does not bruise/bleed easily.  Skin: Negative for nail changes.  Musculoskeletal: Negative for muscle weakness and myalgias.  Gastrointestinal: Negative for abdominal pain, change in bowel habit, nausea and vomiting.  Neurological: Negative for difficulty with concentration, dizziness, focal weakness and headaches.  Psychiatric/Behavioral: Negative for altered mental status and suicidal ideas.  All other systems reviewed and are negative.     Objective:  Blood pressure 126/82, pulse 89, temperature (!) 96.6 F (35.9 C), height 6\' 4"  (1.93 m), weight 212 lb 12.8 oz (96.5 kg), SpO2 98 %. Body mass index is 25.9 kg/m.    Physical Exam  Constitutional: He is oriented to person, place, and time. Vital signs are normal. He appears well-developed and  well-nourished.  Well built  HENT:  Head: Normocephalic and atraumatic.  Neck: Normal range of motion.  Cardiovascular: Normal rate, regular rhythm, normal heart sounds and intact distal pulses.  Pulmonary/Chest: Effort normal and breath sounds normal. No accessory muscle usage. No respiratory distress.  Abdominal: Soft. Bowel sounds are normal.  Musculoskeletal: Normal range of motion.  Neurological: He is alert and oriented to person, place, and time.  Skin: Skin is warm and dry.  Vitals reviewed.  Radiology: No results found.  Laboratory examination:    CMP Latest Ref Rng & Units 12/17/2018 08/17/2010 01/17/2008  Glucose 70 - 99 mg/dL 161(W114(H) 960(A100(H) 540(J111(H)  BUN 6 - 20 mg/dL 13 12 13   Creatinine 0.61 - 1.24 mg/dL 8.11(B1.26(H) 1.4 1.5  Sodium 135 - 145 mmol/L 138 141 137  Potassium 3.5 - 5.1 mmol/L 3.4(L) 4.1 3.9  Chloride 98 - 111 mmol/L 102 102 105  CO2 22 - 32 mmol/L 27 - -  Calcium 8.9 - 10.3 mg/dL 9.6 - -  Total Protein 6.5 - 8.1 g/dL 1.4(N8.5(H) - -  Total Bilirubin 0.3 - 1.2 mg/dL 0.8 - -  Alkaline Phos 38 - 126 U/L 88 - -  AST 15 - 41 U/L 22 - -  ALT 0 - 44 U/L 25 - -   CBC Latest Ref Rng & Units 12/17/2018 03/12/2014 08/17/2010  WBC 4.0 - 10.5 K/uL 6.7 6.6 -  Hemoglobin 13.0 - 17.0 g/dL 82.916.2 56.216.1 13.015.0  Hematocrit 39.0 - 52.0 % 48.5 49.3 44.0  Platelets 150 - 400 K/uL 297 - -   Lipid Panel  No results found for: CHOL, TRIG, HDL, CHOLHDL, VLDL, LDLCALC, LDLDIRECT HEMOGLOBIN A1C No results found for: HGBA1C, MPG TSH No results for input(s): TSH in the last 8760 hours.  PRN Meds:. Medications Discontinued During This Encounter  Medication Reason  . fluticasone (FLONASE) 50 MCG/ACT nasal spray   . olopatadine (PATANOL) 0.1 % ophthalmic solution    Current Meds  Medication Sig  . acetaminophen (TYLENOL) 325 MG tablet Take 650 mg by mouth every 6 (six) hours as needed.  . vitamin C (ASCORBIC ACID) 500 MG tablet Take 500 mg by mouth daily.    Cardiac Studies:   Stress  EKG 03/27/2014: Indications: Assessment of Chest Pain. Conclusions: Negative for ischemia. Excellent effort. The patient exercised according to the Bruce protocol, Total time recorded 9 Min. 39 sec. achieving a max heart rate of 180 which was 100% of MPHR for age and 10.0 METS of work. Baseline NIBP was 118/80. Peak NIBP was 118/80 MaxSysp was: 152 MaxDiasp was: 80. The baseline ECG showed NSR,Early repolarization. During exercise there was no ST-T changes of ischemia. Symptoms: MPHR (100%) achieved. Arrhythmia: None. Continue primary prevention.  Assessment:  ICD-10-CM   1. Angina pectoris (HCC)  I20.9 EKG 12-Lead    CT COROARY FRACTIONAL FLOW RESERVE (FFR)    CT CORONARY FRACTIONAL FLOW RESERVE FLUID ANALYSIS    Lipid Profile  2. Dyspnea on exertion  R06.09 CT COROARY FRACTIONAL FLOW RESERVE (FFR)    CT CORONARY FRACTIONAL FLOW RESERVE FLUID ANALYSIS    PCV ECHOCARDIOGRAM COMPLETE  3. Abnormal EKG  R94.31    EKG 01/10/2019: Normal sinus rhythm at 84 bpm, normal axis, T wave inversion in V3. Abnormal EKG   EKG 03/18/2014: Normal sinus rhythm at a rate of 60 BPM, diffuse ST changes suggestive of early repolarization, normal axis, normal intervals, no ischemia  Recommendations:   Patient has concerning symptoms of angina. He is without history of hypertension, hyperlipidemia, or diabetes. He has had negative treadmill stress test in 2015. EKG is abnormal. Will further evaluate with coronary CTA. Will also obtain echocardiogram to exclude any structural abnormalities. I have advised him to start daily 81 mg ASA until he can have further cardiac workup. No bleeding disorders. I will also start Metoprolol succinate 25 mg daily for anti-anginal therapy. He has not had recent lipids, will obtain this for further risk stratification. I have advised him to take it easy until further testing is complete. We will see him back after the test for further recommendations.   *I have  discussed this case with Dr. Jacinto HalimGanji and he personally examined the patient and participated in formulating the plan.*   Toniann FailAshton Haynes Chadd Tollison, MSN, APRN, FNP-C [facility] [place] Office: [phone] Fax: [phone]

## 2019-01-15 ENCOUNTER — Other Ambulatory Visit: Payer: Self-pay | Admitting: Cardiology

## 2019-01-15 DIAGNOSIS — I209 Angina pectoris, unspecified: Secondary | ICD-10-CM

## 2019-01-17 ENCOUNTER — Other Ambulatory Visit: Payer: Self-pay | Admitting: Cardiology

## 2019-01-18 LAB — LIPID PANEL
Chol/HDL Ratio: 4 ratio (ref 0.0–5.0)
Cholesterol, Total: 183 mg/dL (ref 100–199)
HDL: 46 mg/dL (ref >39)
LDL Calculated: 126 mg/dL — ABNORMAL HIGH (ref 0–99)
Triglycerides: 54 mg/dL (ref 0–149)
VLDL Cholesterol Cal: 11 mg/dL (ref 5–40)

## 2019-01-21 ENCOUNTER — Telehealth: Payer: Self-pay

## 2019-01-21 ENCOUNTER — Telehealth (HOSPITAL_COMMUNITY): Payer: Self-pay | Admitting: Emergency Medicine

## 2019-01-21 NOTE — Telephone Encounter (Signed)
Reaching out to patient to offer assistance regarding upcoming cardiac imaging study; pt verbalizes understanding of appt date/time, parking situation and where to check in, pre-test NPO status and medications ordered, and verified current allergies; name and call back number provided for further questions should they arise Haven Foss RN Navigator Cardiac Imaging [facility] [phone] office [phone] cell  Pt denies covid symptoms, verbalized understanding of visitor policy. 

## 2019-01-21 NOTE — Telephone Encounter (Signed)
Reduce to 1/2 tablet and see how he does, then stop if difficult

## 2019-01-21 NOTE — Telephone Encounter (Signed)
Pt called c/o metoprolol giving him dizziness, blurred vision, ringing in R ear and pressure headaches;  He just started this medication and he wants to know can he come off  [phone]

## 2019-01-22 ENCOUNTER — Ambulatory Visit (HOSPITAL_COMMUNITY): Admission: RE | Admit: 2019-01-22 | Payer: BC Managed Care – PPO | Source: Ambulatory Visit

## 2019-01-22 ENCOUNTER — Encounter: Payer: BC Managed Care – PPO | Admitting: *Deleted

## 2019-01-22 ENCOUNTER — Ambulatory Visit (HOSPITAL_COMMUNITY)
Admission: RE | Admit: 2019-01-22 | Discharge: 2019-01-22 | Disposition: A | Discharge status: Home / Self Care | Payer: BC Managed Care – PPO | Source: Ambulatory Visit | Attending: Cardiology | Admitting: Cardiology

## 2019-01-22 ENCOUNTER — Other Ambulatory Visit: Payer: Self-pay

## 2019-01-22 VITALS — Temp 97.1°F

## 2019-01-22 DIAGNOSIS — I209 Angina pectoris, unspecified: Secondary | ICD-10-CM | POA: Insufficient documentation

## 2019-01-22 DIAGNOSIS — Z006 Encounter for examination for normal comparison and control in clinical research program: Secondary | ICD-10-CM

## 2019-01-22 MED ORDER — IOHEXOL 350 MG/ML SOLN
80.0000 mL | Freq: Once | INTRAVENOUS | Status: AC | PRN
  Administered 2019-01-22: 80 mL via INTRAVENOUS

## 2019-01-22 MED ORDER — NITROGLYCERIN 0.4 MG SL SUBL
SUBLINGUAL_TABLET | SUBLINGUAL | Status: AC
  Filled 2019-01-22: qty 2

## 2019-01-22 MED ORDER — METOPROLOL TARTRATE 5 MG/5ML IV SOLN
5.0000 mg | INTRAVENOUS | Status: DC | PRN
  Filled 2019-01-22: qty 5

## 2019-01-22 MED ORDER — NITROGLYCERIN 0.4 MG SL SUBL
0.8000 mg | SUBLINGUAL_TABLET | SUBLINGUAL | Status: DC | PRN
  Administered 2019-01-22: 0.8 mg via SUBLINGUAL
  Filled 2019-01-22 (×2): qty 25

## 2019-01-22 NOTE — Research (Signed)
Subject Name: Colin Ross  Subject met inclusion and exclusion criteria.  The informed consent form, study requirements and expectations were reviewed with the subject and questions and concerns were addressed prior to the signing of the consent form.  The subject verbalized understanding of the trial requirements.  The subject agreed to participate in the CADFEM G4 trial and signed the informed consent on07/29/20.  The informed consent was obtained prior to performance of any protocol-specific procedures for the subject.  A copy of the signed informed consent was given to the subject and a copy was placed in the subject's medical record.   [facility] [employer] [place]

## 2019-01-27 ENCOUNTER — Other Ambulatory Visit: Payer: Self-pay

## 2019-01-27 ENCOUNTER — Ambulatory Visit (INDEPENDENT_AMBULATORY_CARE_PROVIDER_SITE_OTHER): Payer: BC Managed Care – PPO

## 2019-01-27 DIAGNOSIS — R0609 Other forms of dyspnea: Secondary | ICD-10-CM | POA: Diagnosis not present

## 2019-02-06 ENCOUNTER — Encounter: Payer: Self-pay | Admitting: Cardiology

## 2019-02-06 DIAGNOSIS — R0789 Other chest pain: Secondary | ICD-10-CM | POA: Insufficient documentation

## 2019-02-06 DIAGNOSIS — I10 Essential (primary) hypertension: Secondary | ICD-10-CM

## 2019-02-06 HISTORY — DX: Essential (primary) hypertension: I10

## 2019-02-06 HISTORY — DX: Other chest pain: R07.89

## 2019-02-07 ENCOUNTER — Ambulatory Visit (INDEPENDENT_AMBULATORY_CARE_PROVIDER_SITE_OTHER): Payer: BC Managed Care – PPO | Admitting: Cardiology

## 2019-02-07 ENCOUNTER — Encounter: Payer: Self-pay | Admitting: Cardiology

## 2019-02-07 ENCOUNTER — Other Ambulatory Visit: Payer: Self-pay

## 2019-02-07 VITALS — BP 128/81 | HR 83 | Temp 97.0°F | Ht 76.0 in | Wt 212.0 lb

## 2019-02-07 DIAGNOSIS — R0789 Other chest pain: Secondary | ICD-10-CM | POA: Diagnosis not present

## 2019-02-07 DIAGNOSIS — H9319 Tinnitus, unspecified ear: Secondary | ICD-10-CM | POA: Diagnosis not present

## 2019-02-07 NOTE — Progress Notes (Signed)
Primary Physician:  Patient, No Pcp Per   Patient ID: Colin Ross, male    DOB: 1975-12-03, 43 y.o.   MRN: [id number]  Subjective:    Chief Complaint  Patient presents with  . Chest Pain    test results  . Follow-up    HPI: Colin Ross  is a 43 y.o. male  with no significant past medical history, presents for evaluation of chest pain.  Recently seen for chest heaviness, fatigue and shortness of breath. Symptoms felt to be atypical; however, had some concerning features. He underwent coronary CTA and echocardiogram and now presents for follow up.   He was started on ASA and Metoprolol at his last visit. He developed persistent ringing in his ears 2 days after starting the Metoprolol. He has since stopped the medication as of 1 week ago, but has continued to have almost persistent ringing.   He does not have a PCP. Denies any hypertension, hyperlipidemia, or diabetes. He states prior to this he would say he was in the best shape that he has been in in a long time.   No former tobacco or drug use. Occasional alcohol use.   Past Medical History:  Diagnosis Date  . Allergy   . Anxiety   . Atypical chest pain 02/06/2019  . Chest pain    negative for ischemia  . HTN (hypertension) 02/06/2019    Past Surgical History:  Procedure Laterality Date  . none      Social History   Socioeconomic History  . Marital status: Married    Spouse name: Not on file  . Number of children: 2  . Years of education: Not on file  . Highest education level: Not on file  Occupational History  . Not on file  Social Needs  . Financial resource strain: Not on file  . Food insecurity    Worry: Not on file    Inability: Not on file  . Transportation needs    Medical: Not on file    Non-medical: Not on file  Tobacco Use  . Smoking status: Never Smoker  . Smokeless tobacco: Never Used  Substance and Sexual Activity  . Alcohol use: No    Alcohol/week: 0.0 standard drinks  . Drug  use: No  . Sexual activity: Yes    Partners: Female  Lifestyle  . Physical activity    Days per week: Not on file    Minutes per session: Not on file  . Stress: Not on file  Relationships  . Social Musicianconnections    Talks on phone: Not on file    Gets together: Not on file    Attends religious service: Not on file    Active member of club or organization: Not on file    Attends meetings of clubs or organizations: Not on file    Relationship status: Not on file  . Intimate partner violence    Fear of current or ex partner: Not on file    Emotionally abused: Not on file    Physically abused: Not on file    Forced sexual activity: Not on file  Other Topics Concern  . Not on file  Social History Narrative  . Not on file    Review of Systems  Constitution: Negative for decreased appetite, malaise/fatigue, weight gain and weight loss.  Eyes: Negative for visual disturbance.  Cardiovascular: Negative for chest pain, claudication, dyspnea on exertion, leg swelling, orthopnea, palpitations and syncope.  Respiratory: Negative for hemoptysis  and wheezing.   Endocrine: Negative for cold intolerance and heat intolerance.  Hematologic/Lymphatic: Does not bruise/bleed easily.  Skin: Negative for nail changes.  Musculoskeletal: Negative for muscle weakness and myalgias.  Gastrointestinal: Negative for abdominal pain, change in bowel habit, nausea and vomiting.  Neurological: Negative for difficulty with concentration, dizziness, focal weakness and headaches.  Psychiatric/Behavioral: Negative for altered mental status and suicidal ideas.  All other systems reviewed and are negative.     Objective:  Blood pressure 128/81, pulse 83, temperature (!) 97 F (36.1 C), height 6\' 4"  (1.93 m), weight 212 lb (96.2 kg), SpO2 100 %. Body mass index is 25.81 kg/m.    Physical Exam  Constitutional: He is oriented to person, place, and time. Vital signs are normal. He appears well-developed and  well-nourished.  Well built  HENT:  Head: Normocephalic and atraumatic.  Neck: Normal range of motion.  Cardiovascular: Normal rate, regular rhythm, normal heart sounds and intact distal pulses.  Pulmonary/Chest: Effort normal and breath sounds normal. No accessory muscle usage. No respiratory distress.  Abdominal: Soft. Bowel sounds are normal.  Musculoskeletal: Normal range of motion.  Neurological: He is alert and oriented to person, place, and time.  Skin: Skin is warm and dry.  Vitals reviewed.  Radiology: No results found.  Laboratory examination:    CMP Latest Ref Rng & Units 12/17/2018 08/17/2010 01/17/2008  Glucose 70 - 99 mg/dL 161(W114(H) 960(A100(H) 540(J111(H)  BUN 6 - 20 mg/dL 13 12 13   Creatinine 0.61 - 1.24 mg/dL 8.11(B1.26(H) 1.4 1.5  Sodium 135 - 145 mmol/L 138 141 137  Potassium 3.5 - 5.1 mmol/L 3.4(L) 4.1 3.9  Chloride 98 - 111 mmol/L 102 102 105  CO2 22 - 32 mmol/L 27 - -  Calcium 8.9 - 10.3 mg/dL 9.6 - -  Total Protein 6.5 - 8.1 g/dL 1.4(N8.5(H) - -  Total Bilirubin 0.3 - 1.2 mg/dL 0.8 - -  Alkaline Phos 38 - 126 U/L 88 - -  AST 15 - 41 U/L 22 - -  ALT 0 - 44 U/L 25 - -   CBC Latest Ref Rng & Units 12/17/2018 03/12/2014 08/17/2010  WBC 4.0 - 10.5 K/uL 6.7 6.6 -  Hemoglobin 13.0 - 17.0 g/dL 82.916.2 56.216.1 13.015.0  Hematocrit 39.0 - 52.0 % 48.5 49.3 44.0  Platelets 150 - 400 K/uL 297 - -   Lipid Panel     Component Value Date/Time   CHOL 183 01/17/2019 0841   TRIG 54 01/17/2019 0841   HDL 46 01/17/2019 0841   CHOLHDL 4.0 01/17/2019 0841   LDLCALC 126 (H) 01/17/2019 0841   HEMOGLOBIN A1C No results found for: HGBA1C, MPG TSH No results for input(s): TSH in the last 8760 hours.  PRN Meds:. Medications Discontinued During This Encounter  Medication Reason  . metoprolol succinate (TOPROL-XL) 25 MG 24 hr tablet   . aspirin EC 81 MG tablet    Current Meds  Medication Sig  . acetaminophen (TYLENOL) 325 MG tablet Take 650 mg by mouth every 6 (six) hours as needed.  Marland Kitchen. aspirin EC 81  MG tablet Take 81 mg by mouth daily as needed.  . cholecalciferol (VITAMIN D3) 25 MCG (1000 UT) tablet Take 1,000 Units by mouth daily.  . vitamin C (ASCORBIC ACID) 500 MG tablet Take 500 mg by mouth daily.  . Zinc Sulfate (ZINC 15 PO) Take 1 capsule by mouth daily.    Cardiac Studies:   Coronary CTA 01/22/2019:  1. Coronary calcium score of 0. This was 0  percentile for age and sex matched control.  2. Normal coronary origin with right dominance.  3. No evidence of CAD.  Echocardiogram 01/27/2019: Normal LV systolic function with EF 55%. Left ventricle cavity is normal in size. Mild concentric hypertrophy of the left ventricle. Normal global wall motion. Normal diastolic filling pattern. Calculated EF 55%. Mild to moderate mitral regurgitation. Inadequate TR jet to estimate pulmonary artery systolic pressure. Normal right atrial pressure.   Stress EKG 03/27/2014: Indications: Assessment of Chest Pain. Conclusions: Negative for ischemia. Excellent effort. The patient exercised according to the Bruce protocol, Total time recorded 9 Min. 39 sec. achieving a max heart rate of 180 which was 100% of MPHR for age and 10.0 METS of work. Baseline NIBP was 118/80. Peak NIBP was 118/80 MaxSysp was: 152 MaxDiasp was: 80. The baseline ECG showed NSR,Early repolarization. During exercise there was no ST-T changes of ischemia. Symptoms: MPHR (100%) achieved. Arrhythmia: None. Continue primary prevention.  Assessment:     ICD-10-CM   1. Atypical chest pain  R07.89   2. Essential hypertension  I10    EKG 01/10/2019: Normal sinus rhythm at 84 bpm, normal axis, T wave inversion in V3. Abnormal EKG   EKG 03/18/2014: Normal sinus rhythm at a rate of 60 BPM, diffuse ST changes suggestive of early repolarization, normal axis, normal intervals, no ischemia  Recommendations:   I have discussed recently obtained Coronary CTA and echocardiogram results with the patient, normal coronary  arteries by CT scan. Echocardiogram was also essentially normal with mild LVH likely related to athlete. His symptoms of atypical chest pain have resolved since last seen by [place]. I have asked him to stop ASA therapy. He was placed on metoprolol at his last visit for cardiac protection given his symptoms, but unfortunately, he developed persistent tinnitus 2 days after starting the medication. He has since stopped the medication as of 1 week ago. Hopefully this will resolve with more time. I have asked him to notify me if it does not improve in the next 2 weeks and if not, will refer to ENT for evaluation.    Miquel Dunn, MSN, APRN, FNP-C [facility] [place] Office: [phone] Fax: [phone]

## 2019-09-08 IMAGING — CT CT HEAR MORPH WITH CTA COR WITH SCORE WITH CA WITH CONTRAST AND
4 of 7 series · 8 of 20 positions shown, 9 images · IV contrast (APPLIED)
Comparison: None.
COMPARISON: None.

Addendum:
EXAM:
OVER-READ INTERPRETATION  CT CHEST

The following report is an over-read performed by radiologist Dr.
Lorenz Jumper [REDACTED] on 01/22/2019. This
over-read does not include interpretation of cardiac or coronary
anatomy or pathology. The coronary calcium score/coronary CTA
interpretation by the cardiologist is attached.
TECHNIQUE: The patient was scanned on a Phillips Force scanner.

[Series 6: best diast 74 % · axial · 0.39mm/px · z∈[+1222,+1272]mm · 2 of 378 slices shown, 3 images]
[im 126/378  vessel]
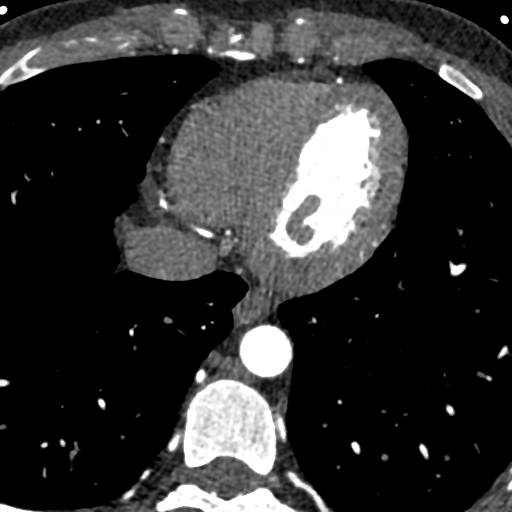
[im 126/378  lung]
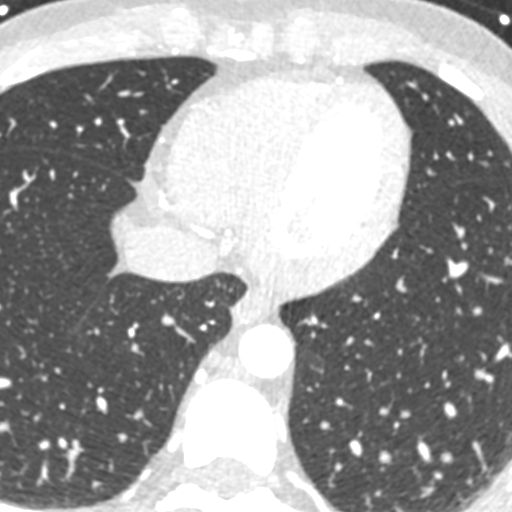
[im 252/378  vessel]
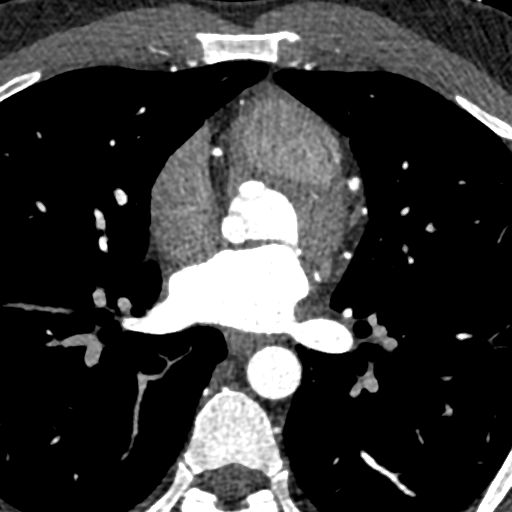

[Series 7: best syst 35 % · axial · 0.39mm/px · z∈[+1222,+1272]mm · 2 of 378 slices shown]
[im 126/378  vessel]
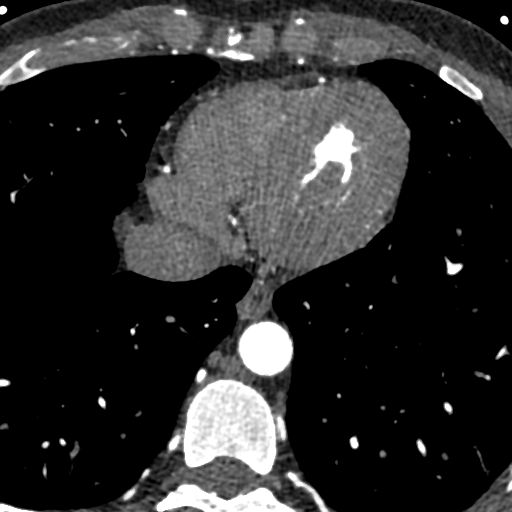
[im 252/378  vessel]
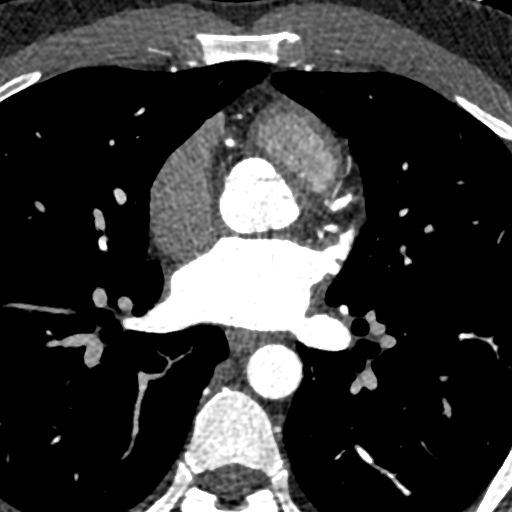

[Series 8: ts diast sharp 74 % · axial · 0.39mm/px · z∈[+1222,+1272]mm · 2 of 378 slices shown]
[im 126/378  lung]
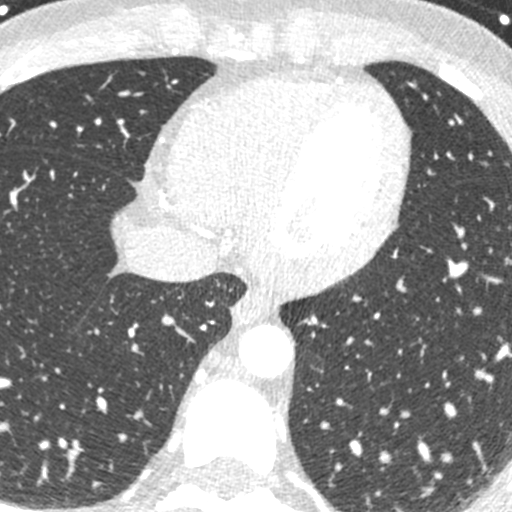
[im 252/378  lung]
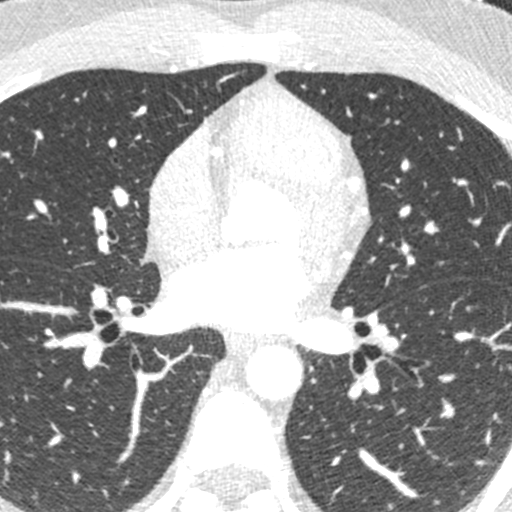

[Series 9: ts syst sharp 35 % · axial · 0.39mm/px · z∈[+1222,+1272]mm · 2 of 378 slices shown]
[im 126/378  lung]
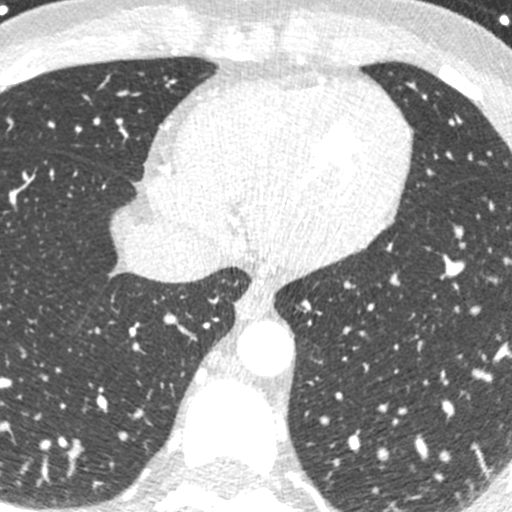
[im 252/378  lung]
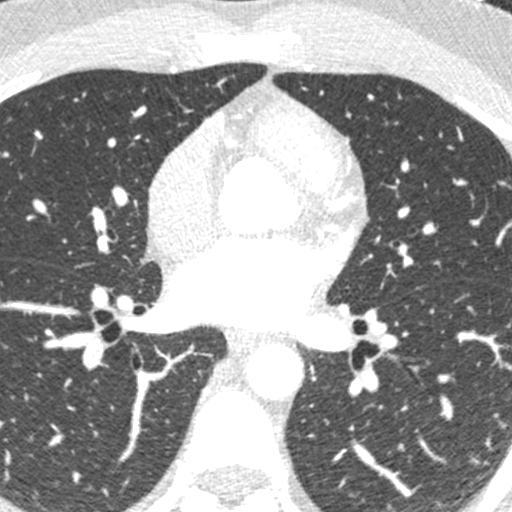

[8 of 20 positions shown; findings below may reference images not displayed]

FINDINGS: Vascular: Negative.

Mediastinum/Nodes: Negative.

Lungs/Pleura: Negative.

Upper Abdomen: Negative.

Musculoskeletal: Negative.
IMPRESSION: Extracardiac structures are unremarkable.

EXAM:
Cardiac/Coronary  CT
FINDINGS: A 120 kV prospective scan was triggered in the descending thoracic
aorta at 111 HU's. Axial non-contrast 3 mm slices were carried out
through the heart. The data set was analyzed on a dedicated work
station and scored using the Agatson method. Gantry rotation speed
was 250 msecs and collimation was .6 mm. No beta blockade and 0.8 mg
of sl NTG was given. The 3D data set was reconstructed in 5%
intervals of the 67-82 % of the R-R cycle. Diastolic phases were
analyzed on a dedicated work station using MPR, MIP and VRT modes.
The patient received 80 cc of contrast.

Aorta:  Normal size.  No calcifications.  No dissection.

Aortic Valve:  Trileaflet.  No calcifications.

Coronary Arteries:  Normal coronary origin.  Right dominance.

RCA is a large dominant artery that gives rise to 2 small acute RVM
branches, PDA and PLVB. There is no plaque.

Left main is a large artery that gives rise to LAD and LCX arteries.
There is no plaque.

LAD is a large vessel that gives rise to a large branching diagonal.
There is no plaque.

LCX is a non-dominant artery that gives rise to one large
trifurcating OM1 branch. There is no plaque in the proximal and mid
OM1 branch and the distal branches are patent but cannot assess for
plaque due to small caliber.

Other findings:

Normal pulmonary vein drainage into the left atrium.

Normal let atrial appendage without a thrombus.

Normal size of the pulmonary artery.
IMPRESSION: 1. Coronary calcium score of 0. This was 0 percentile for age and
sex matched control.

2. Normal coronary origin with right dominance.

3. No evidence of CAD.

Radovancevic Danijel

*** End of Addendum ***
EXAM:
OVER-READ INTERPRETATION  CT CHEST

The following report is an over-read performed by radiologist Dr.
Lorenz Jumper [[email or web address]] on 01/22/2019. This
over-read does not include interpretation of cardiac or coronary
anatomy or pathology. The coronary calcium score/coronary CTA
interpretation by the cardiologist is attached.
FINDINGS: Vascular: Negative.

Mediastinum/Nodes: Negative.

Lungs/Pleura: Negative.

Upper Abdomen: Negative.

Musculoskeletal: Negative.
IMPRESSION: Extracardiac structures are unremarkable.

## 2022-10-28 ENCOUNTER — Encounter (HOSPITAL_BASED_OUTPATIENT_CLINIC_OR_DEPARTMENT_OTHER): Payer: Self-pay | Admitting: Emergency Medicine

## 2022-10-28 ENCOUNTER — Emergency Department (HOSPITAL_BASED_OUTPATIENT_CLINIC_OR_DEPARTMENT_OTHER)
Admission: EM | Admit: 2022-10-28 | Discharge: 2022-10-29 | Disposition: A | Discharge status: Home / Self Care | Payer: Self-pay | Attending: Emergency Medicine | Admitting: Emergency Medicine

## 2022-10-28 ENCOUNTER — Emergency Department (HOSPITAL_BASED_OUTPATIENT_CLINIC_OR_DEPARTMENT_OTHER): Payer: Self-pay

## 2022-10-28 ENCOUNTER — Other Ambulatory Visit: Payer: Self-pay

## 2022-10-28 DIAGNOSIS — N23 Unspecified renal colic: Secondary | ICD-10-CM | POA: Insufficient documentation

## 2022-10-28 LAB — COMPREHENSIVE METABOLIC PANEL
ALT: 29 U/L (ref 0–44)
AST: 21 U/L (ref 15–41)
Albumin: 4.2 g/dL (ref 3.5–5.0)
Alkaline Phosphatase: 92 U/L (ref 38–126)
Anion gap: 9 (ref 5–15)
BUN: 17 mg/dL (ref 6–20)
CO2: 24 mmol/L (ref 22–32)
Calcium: 8.8 mg/dL — ABNORMAL LOW (ref 8.9–10.3)
Chloride: 102 mmol/L (ref 98–111)
Creatinine, Ser: 1.34 mg/dL — ABNORMAL HIGH (ref 0.61–1.24)
GFR, Estimated: >60 mL/min (ref >60)
Glucose, Bld: 104 mg/dL — ABNORMAL HIGH (ref 70–99)
Potassium: 3.7 mmol/L (ref 3.5–5.1)
Sodium: 135 mmol/L (ref 135–145)
Total Bilirubin: 1.1 mg/dL (ref 0.3–1.2)
Total Protein: 7.9 g/dL (ref 6.5–8.1)

## 2022-10-28 LAB — URINALYSIS, MICROSCOPIC (REFLEX)

## 2022-10-28 LAB — URINALYSIS, ROUTINE W REFLEX MICROSCOPIC
Bilirubin Urine: NEGATIVE
Glucose, UA: NEGATIVE mg/dL
Ketones, ur: NEGATIVE mg/dL
Leukocytes,Ua: NEGATIVE
Nitrite: NEGATIVE
Protein, ur: NEGATIVE mg/dL
Specific Gravity, Urine: 1.02 (ref 1.005–1.030)
pH: 5.5 (ref 5.0–8.0)

## 2022-10-28 LAB — CBC WITH DIFFERENTIAL/PLATELET
Abs Immature Granulocytes: 0.04 10*3/uL (ref 0.00–0.07)
Basophils Absolute: 0 10*3/uL (ref 0.0–0.1)
Basophils Relative: 0 %
Eosinophils Absolute: 0.2 10*3/uL (ref 0.0–0.5)
Eosinophils Relative: 2 %
HCT: 42.3 % (ref 39.0–52.0)
Hemoglobin: 14.7 g/dL (ref 13.0–17.0)
Immature Granulocytes: 0 %
Lymphocytes Relative: 29 %
Lymphs Abs: 2.6 10*3/uL (ref 0.7–4.0)
MCH: 29.8 pg (ref 26.0–34.0)
MCHC: 34.8 g/dL (ref 30.0–36.0)
MCV: 85.6 fL (ref 80.0–100.0)
Monocytes Absolute: 0.9 10*3/uL (ref 0.1–1.0)
Monocytes Relative: 10 %
Neutro Abs: 5.4 10*3/uL (ref 1.7–7.7)
Neutrophils Relative %: 59 %
Platelets: 292 10*3/uL (ref 150–400)
RBC: 4.94 MIL/uL (ref 4.22–5.81)
RDW: 12.8 % (ref 11.5–15.5)
WBC: 9.1 10*3/uL (ref 4.0–10.5)
nRBC: 0 % (ref 0.0–0.2)

## 2022-10-28 LAB — D-DIMER, QUANTITATIVE: D-Dimer, Quant: 0.29 ug/mL-FEU (ref 0.00–0.50)

## 2022-10-28 LAB — TROPONIN I (HIGH SENSITIVITY): Troponin I (High Sensitivity): 2 ng/L (ref <18)

## 2022-10-28 MED ORDER — IBUPROFEN 800 MG PO TABS: 800.0000 mg | ORAL_TABLET | Freq: Three times a day (TID) | ORAL | 0 refills | Status: DC

## 2022-10-28 MED ORDER — SODIUM CHLORIDE 0.9 % IV BOLUS
1000.0000 mL | Freq: Once | INTRAVENOUS | Status: AC
  Administered 2022-10-28: 1000 mL via INTRAVENOUS

## 2022-10-28 MED ORDER — KETOROLAC TROMETHAMINE 30 MG/ML IJ SOLN
30.0000 mg | Freq: Once | INTRAMUSCULAR | Status: AC
  Administered 2022-10-28: 30 mg via INTRAVENOUS
  Filled 2022-10-28: qty 1

## 2022-10-28 NOTE — ED Provider Notes (Signed)
EMERGENCY DEPARTMENT AT [facility] Provider Note   CSN: [id number] Arrival date & time: 10/28/22  2143     History  Chief Complaint  Patient presents with   Flank Pain    Colin Ross is a 47 y.o. male here presenting with flank pain.  Patient states that about 30 minutes prior to arrival, he has acute onset of flank pain.  It started on the right then became on the left.  He states it is sharp in nature and 6 out of 10 pain.  Patient denies any heavy lifting.  Patient states that it is worse when he takes a deep breath.  He denies any chest pain or shortness of breath however.  Denies any recent travel.  He states that he has no medical problems.  He denies any history of kidney stones.  The history is provided by the patient.       Home Medications Prior to Admission medications   Medication Sig Start Date End Date Taking? Authorizing Provider  acetaminophen (TYLENOL) 325 MG tablet Take 650 mg by mouth every 6 (six) hours as needed.    [provider]  cholecalciferol (VITAMIN D3) 25 MCG (1000 UT) tablet Take 1,000 Units by mouth daily.    [provider]  vitamin C (ASCORBIC ACID) 500 MG tablet Take 500 mg by mouth daily.    [provider]  Zinc Sulfate (ZINC 15 PO) Take 1 capsule by mouth daily.    [provider]      Allergies    Penicillins    Review of Systems   Review of Systems  Genitourinary:  Positive for flank pain.  All other systems reviewed and are negative.   Physical Exam Updated Vital Signs BP (!) 130/94 (BP Location: Right Arm)   Pulse 95   Temp (!) 97.5 F (36.4 C) (Oral)   Resp 18   Ht 6\' 4"  (1.93 m)   Wt 99.8 kg   SpO2 96%   BMI 26.78 kg/m  Physical Exam Vitals and nursing note reviewed.  Constitutional:      Comments: Slightly uncomfortable  HENT:     Head: Normocephalic.     Nose: Nose normal.     Mouth/Throat:     Mouth: Mucous membranes are moist.  Eyes:      Extraocular Movements: Extraocular movements intact.     Pupils: Pupils are equal, round, and reactive to light.  Cardiovascular:     Rate and Rhythm: Normal rate and regular rhythm.     Pulses: Normal pulses.     Heart sounds: Normal heart sounds.  Pulmonary:     Effort: Pulmonary effort is normal.     Breath sounds: Normal breath sounds.  Abdominal:     General: Abdomen is flat.     Palpations: Abdomen is soft.     Comments: Mild bilateral CVA tenderness  Musculoskeletal:        General: Normal range of motion.     Cervical back: Normal range of motion and neck supple.     Comments: No pedal edema or calf tenderness  Skin:    General: Skin is warm.     Capillary Refill: Capillary refill takes less than 2 seconds.  Neurological:     General: No focal deficit present.     Mental Status: He is oriented to person, place, and time.  Psychiatric:        Mood and Affect: Mood normal.  Behavior: Behavior normal.     ED Results / Procedures / Treatments   Labs (all labs ordered are listed, but only abnormal results are displayed) Labs Reviewed  CBC WITH DIFFERENTIAL/PLATELET  COMPREHENSIVE METABOLIC PANEL  D-DIMER, QUANTITATIVE  URINALYSIS, ROUTINE W REFLEX MICROSCOPIC  TROPONIN I (HIGH SENSITIVITY)    EKG None  Radiology No results found.  Procedures Procedures    Medications Ordered in ED Medications  sodium chloride 0.9 % bolus 1,000 mL (has no administration in time range)  ketorolac (TORADOL) 30 MG/ML injection 30 mg (has no administration in time range)    ED Course/ Medical Decision Making/ A&P                             Medical Decision Making Colin Ross is a 47 y.o. male here presenting with bilateral flank pain.  I think likely renal colic versus musculoskeletal.  Since it is pleuritic nature also consider PE.  Since he is low risk for PE we will get a D-dimer and labs and CT renal stone and urinalysis.  10:55 PM Reviewed patient's labs  and independently interpreted imaging studies.  Patient's D-dimer is negative.  UA showed some blood and CT showed punctate left intrarenal stone with no hydronephrosis. I suspected that he may have passed a kidney stone.  Patient's pain is under control with Toradol.  I told him that he likely will be sore for several days.  Recommend around-the-clock ibuprofen.  Will have him follow-up with urology if he has persistent pain  Problems Addressed: Renal colic on left side: acute illness or injury  Amount and/or Complexity of Data Reviewed Labs: ordered. Decision-making details documented in ED Course. Radiology: ordered and independent interpretation performed. Decision-making details documented in ED Course. ECG/medicine tests: ordered and independent interpretation performed. Decision-making details documented in ED Course.  Risk Prescription drug management.    Final Clinical Impression(s) / ED Diagnoses Final diagnoses:  None    Rx / DC Orders ED Discharge Orders     None         Charlynne Pander, MD 10/28/22 2308

## 2022-10-28 NOTE — ED Triage Notes (Signed)
Pt reports sharp shooting pain to bil flank areas that started 30-45 minutes ago; denies NVD

## 2022-10-28 NOTE — Discharge Instructions (Addendum)
I suspect that you may have passed a kidney stone on the left side.  You still have a small stone inside the left kidney  Please stay hydrated.  You likely will be sore for several days.  Please take ibuprofen 800 mg every 8 hours as needed  If you have pain in the week I recommend that you follow-up with urology  Return to ER if you have worse flank pain or abdominal pain or vomiting or fever

## 2022-12-04 ENCOUNTER — Ambulatory Visit
Admission: EM | Admit: 2022-12-04 | Discharge: 2022-12-04 | Disposition: A | Discharge status: Home / Self Care | Payer: BC Managed Care – PPO | Attending: Nurse Practitioner | Admitting: Nurse Practitioner

## 2022-12-04 ENCOUNTER — Other Ambulatory Visit: Payer: Self-pay

## 2022-12-04 DIAGNOSIS — M5442 Lumbago with sciatica, left side: Secondary | ICD-10-CM | POA: Diagnosis not present

## 2022-12-04 MED ORDER — METHYLPREDNISOLONE ACETATE 80 MG/ML IJ SUSP
60.0000 mg | Freq: Once | INTRAMUSCULAR | Status: AC
  Administered 2022-12-04: 60 mg via INTRAMUSCULAR

## 2022-12-04 MED ORDER — PREDNISONE 10 MG (21) PO TBPK
ORAL_TABLET | Freq: Every day | ORAL | 0 refills | Status: AC
Start: 2022-12-05 — End: ?

## 2022-12-04 MED ORDER — KETOROLAC TROMETHAMINE 30 MG/ML IJ SOLN
30.0000 mg | Freq: Once | INTRAMUSCULAR | Status: AC
  Administered 2022-12-04: 30 mg via INTRAMUSCULAR

## 2022-12-04 MED ORDER — CYCLOBENZAPRINE HCL 10 MG PO TABS
10.0000 mg | ORAL_TABLET | Freq: Two times a day (BID) | ORAL | 0 refills | Status: AC | PRN
Start: 2022-12-04 — End: ?

## 2022-12-04 NOTE — ED Provider Notes (Signed)
[facility]    CSN: [id number] Arrival date & time: 12/04/22  1220      History   Chief Complaint Chief Complaint  Patient presents with   Back Pain    HPI Colin Ross is a 47 y.o. male presents for evaluation of pain.  Patient reports 2 days ago he bent over to pick up a box when he had onset of a left lower back pain that has been intermittent since that time.  He feels like it is gotten worse over the past day and does radiate slightly down to his left hip.  Denies any numbness/tingling/weakness of his lower extremities, no bowel or bladder incontinence, no saddle paresthesia.  No history of back injuries or surgeries.  He took ibuprofen with minimal improvement of his symptoms.  No other concerns at this time.   Back Pain   Past Medical History:  Diagnosis Date   Allergy    Anxiety    Atypical chest pain 02/06/2019   Chest pain    negative for ischemia   HTN (hypertension) 02/06/2019    Patient Active Problem List   Diagnosis Date Noted   Atypical chest pain 02/06/2019   HTN (hypertension) 02/06/2019    Past Surgical History:  Procedure Laterality Date   none         Home Medications    Prior to Admission medications   Medication Sig Start Date End Date Taking? Authorizing Provider  cyclobenzaprine (FLEXERIL) 10 MG tablet Take 1 tablet (10 mg total) by mouth 2 (two) times daily as needed for muscle spasms. 12/04/22  Yes Radford Pax, NP  predniSONE (STERAPRED UNI-PAK 21 TAB) 10 MG (21) TBPK tablet Take by mouth daily. Take 6 tabs by mouth daily  for 1 day, then 5 tabs for 1 day, then 4 tabs for 1 day, then 3 tabs for 1 day, 2 tabs for 1 day, then 1 tab by mouth daily for 1 days 12/05/22  Yes Radford Pax, NP  acetaminophen (TYLENOL) 325 MG tablet Take 650 mg by mouth every 6 (six) hours as needed.    [provider]  cholecalciferol (VITAMIN D3) 25 MCG (1000 UT) tablet Take 1,000 Units by mouth daily.    [provider]   ibuprofen (ADVIL) 800 MG tablet Take 1 tablet (800 mg total) by mouth 3 (three) times daily. 10/28/22   Charlynne Pander, MD  vitamin C (ASCORBIC ACID) 500 MG tablet Take 500 mg by mouth daily.    [provider]  Zinc Sulfate (ZINC 15 PO) Take 1 capsule by mouth daily.    [provider]    Family History Family History  Problem Relation Age of Onset   Cancer Mother    Hypertension Mother    Hypertension Father    Diabetes Father     Social History Social History   Tobacco Use   Smoking status: Never   Smokeless tobacco: Never  Vaping Use   Vaping Use: Never used  Substance Use Topics   Alcohol use: No    Alcohol/week: 0.0 standard drinks of alcohol   Drug use: No     Allergies   Penicillins   Review of Systems Review of Systems  Musculoskeletal:  Positive for back pain.     Physical Exam Triage Vital Signs ED Triage Vitals  Enc Vitals Group     BP 12/04/22 1251 (!) 147/82     Pulse Rate 12/04/22 1251 83     Resp  12/04/22 1251 16     Temp 12/04/22 1251 98.1 F (36.7 C)     Temp Source 12/04/22 1251 Oral     SpO2 12/04/22 1251 95 %     Weight --      Height --      Head Circumference --      Peak Flow --      Pain Score 12/04/22 1253 9     Pain Loc --      Pain Edu? --      Excl. in GC? --    No data found.  Updated Vital Signs BP (!) 147/82 (BP Location: Left Arm)   Pulse 83   Temp 98.1 F (36.7 C) (Oral)   Resp 16   SpO2 95%   Visual Acuity Right Eye Distance:   Left Eye Distance:   Bilateral Distance:    Right Eye Near:   Left Eye Near:    Bilateral Near:     Physical Exam Vitals and nursing note reviewed.  Constitutional:      General: He is not in acute distress.    Appearance: Normal appearance. He is not ill-appearing, toxic-appearing or diaphoretic.  HENT:     Head: Normocephalic and atraumatic.  Eyes:     Pupils: Pupils are equal, round, and reactive to light.  Cardiovascular:     Rate and Rhythm:  Normal rate.  Pulmonary:     Effort: Pulmonary effort is normal.  Musculoskeletal:     Lumbar back: Spasms and tenderness present. No swelling, edema, deformity, signs of trauma, lacerations or bony tenderness. Normal range of motion. Positive left straight leg raise test. Negative right straight leg raise test. No scoliosis.     Comments: Strength 5 out of 5 bilateral lower extremities  Skin:    General: Skin is warm and dry.  Neurological:     General: No focal deficit present.     Mental Status: He is alert and oriented to person, place, and time.  Psychiatric:        Mood and Affect: Mood normal.        Behavior: Behavior normal.      UC Treatments / Results  Labs (all labs ordered are listed, but only abnormal results are displayed) Labs Reviewed - No data to display  EKG   Radiology No results found.  Procedures Procedures (including critical care time)  Medications Ordered in UC Medications  ketorolac (TORADOL) 30 MG/ML injection 30 mg (30 mg Intramuscular Given 12/04/22 1314)  methylPREDNISolone acetate (DEPO-MEDROL) injection 60 mg (60 mg Intramuscular Given 12/04/22 1314)    Initial Impression / Assessment and Plan / UC Course  I have reviewed the triage vital signs and the nursing notes.  Pertinent labs & imaging results that were available during my care of the patient were reviewed by me and considered in my medical decision making (see chart for details).     Patient given Toradol and Depo-Medrol injection in clinic.  He was monitored for 15 minutes after injection with no reaction and tolerated well.  Reports improvement in symptoms.  He was instructed no NSAIDs for 24 hours and he verbalized understanding Prednisone taper tomorrow, 6/11 Flexeril as needed.  Side effect profile reviewed Heat and rest PCP follow-up if symptoms do not improve ER precautions reviewed and patient verbalized understanding Final Clinical Impressions(s) / UC Diagnoses    Final diagnoses:  Acute left-sided low back pain with left-sided sciatica     Discharge Instructions  You were given a Toradol injection in clinic today. Do not take any over the counter NSAID's such as Advil, ibuprofen, Aleve, or naproxen for 24 hours.  You may take tylenol if needed Start prednisone taper tomorrow, 6/11 Flexeril as needed.  Please note this medication can make you drowsy.  Do not drink alcohol or drive while on this medication Heat to the back as needed Rest Follow-up with your PCP if your symptoms do not improve Please go to the ER if you develop any worsening symptoms Hope you feel better soon!      ED Prescriptions     Medication Sig Dispense Auth. Provider   cyclobenzaprine (FLEXERIL) 10 MG tablet Take 1 tablet (10 mg total) by mouth 2 (two) times daily as needed for muscle spasms. 10 tablet Radford Pax, NP   predniSONE (STERAPRED UNI-PAK 21 TAB) 10 MG (21) TBPK tablet Take by mouth daily. Take 6 tabs by mouth daily  for 1 day, then 5 tabs for 1 day, then 4 tabs for 1 day, then 3 tabs for 1 day, 2 tabs for 1 day, then 1 tab by mouth daily for 1 days 21 tablet Radford Pax, NP      PDMP not reviewed this encounter.   Radford Pax, NP 12/04/22 1339

## 2022-12-04 NOTE — Discharge Instructions (Addendum)
You were given a Toradol injection in clinic today. Do not take any over the counter NSAID's such as Advil, ibuprofen, Aleve, or naproxen for 24 hours.  You may take tylenol if needed Start prednisone taper tomorrow, 6/11 Flexeril as needed.  Please note this medication can make you drowsy.  Do not drink alcohol or drive while on this medication Heat to the back as needed Rest Follow-up with your PCP if your symptoms do not improve Please go to the ER if you develop any worsening symptoms Hope you feel better soon!

## 2022-12-04 NOTE — ED Triage Notes (Addendum)
Pt presents to UC w/ c/o mid lower back pain x2 days. Pain shoots down left leg. Pt picked up a box 2 days ago and felt pain. Pt took ibuprofen today. Pt states pain is worse when sitting.

## 2023-06-16 ENCOUNTER — Ambulatory Visit
Admission: EM | Admit: 2023-06-16 | Discharge: 2023-06-16 | Disposition: A | Discharge status: Home / Self Care | Payer: BC Managed Care – PPO | Attending: Internal Medicine | Admitting: Internal Medicine

## 2023-06-16 DIAGNOSIS — R109 Unspecified abdominal pain: Secondary | ICD-10-CM

## 2023-06-16 LAB — POCT URINALYSIS DIP (MANUAL ENTRY)
Bilirubin, UA: NEGATIVE
Glucose, UA: NEGATIVE mg/dL
Ketones, POC UA: NEGATIVE mg/dL
Leukocytes, UA: NEGATIVE
Nitrite, UA: NEGATIVE
Protein Ur, POC: NEGATIVE mg/dL
Spec Grav, UA: 1.015 (ref 1.010–1.025)
Urobilinogen, UA: 0.2 U/dL
pH, UA: 6 (ref 5.0–8.0)

## 2023-06-16 MED ORDER — TIZANIDINE HCL 4 MG PO TABS: 4.0000 mg | ORAL_TABLET | Freq: Every day | ORAL | 0 refills | Status: AC

## 2023-06-16 MED ORDER — IBUPROFEN 600 MG PO TABS: 600.0000 mg | ORAL_TABLET | Freq: Four times a day (QID) | ORAL | 0 refills | Status: AC | PRN

## 2023-06-16 NOTE — ED Provider Notes (Signed)
[facility]  Note:  This document was prepared using Conservation officer, historic buildings and may include unintentional dictation errors.  MRN: [id number] DOB: [birth year]  Subjective:   Colin Ross is a 47 y.o. male presenting for 1 month history of persistent intermittent right upper quadrant/right sided abdominal pain with associated nausea, decreased appetite, increased gassiness.  No fever, bloody stool, diarrhea, constipation, vomiting, cough, chest pain, shortness of breath.  No rashes.  Has a history of nephrolithiasis seen on CT scan from earlier this year.  No hematuria, dysuria.  No changes to urination.  No alcohol use.  No drug use.  Hepatitis C from his physical exam blood work was negative.  Has had normal metabolic panel as well.  No current facility-administered medications for this encounter.  Current Outpatient Medications:    acetaminophen (TYLENOL) 325 MG tablet, Take 650 mg by mouth every 6 (six) hours as needed., Disp: , Rfl:    cholecalciferol (VITAMIN D3) 25 MCG (1000 UT) tablet, Take 1,000 Units by mouth daily., Disp: , Rfl:    cyclobenzaprine (FLEXERIL) 10 MG tablet, Take 1 tablet (10 mg total) by mouth 2 (two) times daily as needed for muscle spasms., Disp: 10 tablet, Rfl: 0   ibuprofen (ADVIL) 800 MG tablet, Take 1 tablet (800 mg total) by mouth 3 (three) times daily., Disp: 21 tablet, Rfl: 0   predniSONE (STERAPRED UNI-PAK 21 TAB) 10 MG (21) TBPK tablet, Take by mouth daily. Take 6 tabs by mouth daily  for 1 day, then 5 tabs for 1 day, then 4 tabs for 1 day, then 3 tabs for 1 day, 2 tabs for 1 day, then 1 tab by mouth daily for 1 days, Disp: 21 tablet, Rfl: 0   vitamin C (ASCORBIC ACID) 500 MG tablet, Take 500 mg by mouth daily., Disp: , Rfl:    Zinc Sulfate (ZINC 15 PO), Take 1 capsule by mouth daily., Disp: , Rfl:    Allergies  Allergen Reactions   Penicillins Rash    Past Medical History:  Diagnosis Date   Allergy    Anxiety     Atypical chest pain 02/06/2019   Chest pain    negative for ischemia   HTN (hypertension) 02/06/2019     Past Surgical History:  Procedure Laterality Date   none      Family History  Problem Relation Age of Onset   Cancer Mother    Hypertension Mother    Hypertension Father    Diabetes Father     Social History   Tobacco Use   Smoking status: Never   Smokeless tobacco: Never  Vaping Use   Vaping status: Never Used  Substance Use Topics   Alcohol use: No    Alcohol/week: 0.0 standard drinks of alcohol   Drug use: No    ROS   Objective:   Vitals: BP 127/83 (BP Location: Right Arm)   Pulse 88   Temp 98.2 F (36.8 C) (Oral)   Resp 16   SpO2 98%   Physical Exam Constitutional:      General: He is not in acute distress.    Appearance: Normal appearance. He is well-developed and normal weight. He is not ill-appearing, toxic-appearing or diaphoretic.  HENT:     Right Ear: External ear normal.     Left Ear: External ear normal.     Nose: Nose normal.     Mouth/Throat:     Mouth: Mucous membranes are moist.  Eyes:  General: No scleral icterus.       Right eye: No discharge.        Left eye: No discharge.     Extraocular Movements: Extraocular movements intact.     Conjunctiva/sclera: Conjunctivae normal.  Cardiovascular:     Rate and Rhythm: Normal rate and regular rhythm.     Heart sounds: No murmur heard.    No friction rub. No gallop.  Pulmonary:     Effort: No respiratory distress.     Breath sounds: No wheezing or rales.  Abdominal:     General: Bowel sounds are normal. There is no distension.     Palpations: Abdomen is soft. There is no mass.     Tenderness: There is abdominal tenderness. There is no right CVA tenderness, left CVA tenderness, guarding or rebound.    Skin:    General: Skin is warm and dry.  Neurological:     Mental Status: He is alert and oriented to person, place, and time.     Narrative & Impression  CLINICAL DATA:   Lambert Mody shooting pain to bilateral flank areas.   EXAM: CT ABDOMEN AND PELVIS WITHOUT CONTRAST   TECHNIQUE: Multidetector CT imaging of the abdomen and pelvis was performed following the standard protocol without IV contrast.   RADIATION DOSE REDUCTION: This exam was performed according to the departmental dose-optimization program which includes automated exposure control, adjustment of the mA and/or kV according to patient size and/or use of iterative reconstruction technique.   COMPARISON:  CT abdomen and pelvis 12/07/2017   FINDINGS: Lower chest: No acute abnormality.   Hepatobiliary: Unremarkable noncontrast appearance of the liver. Unremarkable gallbladder. No biliary dilation.   Pancreas: Unremarkable.   Spleen: Unremarkable.   Adrenals/Urinary Tract: Normal adrenal glands. Punctate nonobstructing left nephrolithiasis. No hydronephrosis. Nondistended bladder.   Stomach/Bowel: Normal caliber large and small bowel. No bowel wall thickening. Normal appendix. Stomach is within normal limits.   Vascular/Lymphatic: Mild arterial atherosclerotic calcification. No lymphadenopathy.   Reproductive: Unremarkable.   Other: No free intraperitoneal fluid or air.   Musculoskeletal: No acute fracture.   IMPRESSION: 1. No acute abdominopelvic process. 2. Punctate nonobstructing left nephrolithiasis.     Electronically Signed   By: Minerva Fester M.D.   On: 10/28/2022 22:38   Results for orders placed or performed during the hospital encounter of 06/16/23 (from the past 24 hours)  POCT urinalysis dipstick     Status: Abnormal   Collection Time: 06/16/23  9:16 AM  Result Value Ref Range   Color, UA yellow yellow   Clarity, UA clear clear   Glucose, UA negative negative mg/dL   Bilirubin, UA negative negative   Ketones, POC UA negative negative mg/dL   Spec Grav, UA 5.784 6.962 - 1.025   Blood, UA trace-intact (A) negative   pH, UA 6.0 5.0 - 8.0   Protein Ur, POC  negative negative mg/dL   Urobilinogen, UA 0.2 0.2 or 1.0 E.U./dL   Nitrite, UA Negative Negative   Leukocytes, UA Negative Negative    Assessment and Plan :   PDMP not reviewed this encounter.  1. Right flank pain   2. Right sided abdominal pain    Trace hematuria is not a specific finding as he has a history of left-sided nephrolithiasis.  No signs of acute abdomen.  Will pursue repeat labs.  Recommended he follow-up with his PCP as soon as possible to discuss whether or not they need to pursue an outpatient ultrasound.  Recommended ibuprofen, tizanidine in  the meantime.  Continue to avoid alcohol use.  Counseled patient on potential for adverse effects with medications prescribed/recommended today, ER and return-to-clinic precautions discussed, patient verbalized understanding.    Wallis Bamberg, [place] 06/16/23 [phone]

## 2023-06-16 NOTE — ED Triage Notes (Signed)
Pt c/o RUQ pain, loss of appetite, nausea, increase belching/gas- sx started 1 month ago-has not sought medical care until today-NAD-steady gait

## 2023-06-17 LAB — CBC WITH DIFFERENTIAL/PLATELET
Basophils Absolute: 0 10*3/uL (ref 0.0–0.2)
Basos: 1 %
EOS (ABSOLUTE): 0.1 10*3/uL (ref 0.0–0.4)
Eos: 1 %
Hematocrit: 46.7 % (ref 37.5–51.0)
Hemoglobin: 16 g/dL (ref 13.0–17.7)
Immature Grans (Abs): 0 10*3/uL (ref 0.0–0.1)
Immature Granulocytes: 0 %
Lymphocytes Absolute: 1.6 10*3/uL (ref 0.7–3.1)
Lymphs: 34 %
MCH: 30.9 pg (ref 26.6–33.0)
MCHC: 34.3 g/dL (ref 31.5–35.7)
MCV: 90 fL (ref 79–97)
Monocytes Absolute: 0.4 10*3/uL (ref 0.1–0.9)
Monocytes: 9 %
Neutrophils Absolute: 2.6 10*3/uL (ref 1.4–7.0)
Neutrophils: 55 %
Platelets: 340 10*3/uL (ref 150–450)
RBC: 5.18 x10E6/uL (ref 4.14–5.80)
RDW: 13.2 % (ref 11.6–15.4)
WBC: 4.7 10*3/uL (ref 3.4–10.8)

## 2023-06-17 LAB — COMPREHENSIVE METABOLIC PANEL
ALT: 20 [IU]/L (ref 0–44)
AST: 23 [IU]/L (ref 0–40)
Albumin: 4.6 g/dL (ref 4.1–5.1)
Alkaline Phosphatase: 111 [IU]/L (ref 44–121)
BUN/Creatinine Ratio: 8 — ABNORMAL LOW (ref 9–20)
BUN: 11 mg/dL (ref 6–24)
Bilirubin Total: 0.5 mg/dL (ref 0.0–1.2)
CO2: 23 mmol/L (ref 20–29)
Calcium: 9.4 mg/dL (ref 8.7–10.2)
Chloride: 101 mmol/L (ref 96–106)
Creatinine, Ser: 1.31 mg/dL — ABNORMAL HIGH (ref 0.76–1.27)
Globulin, Total: 2.9 g/dL (ref 1.5–4.5)
Glucose: 101 mg/dL — ABNORMAL HIGH (ref 70–99)
Potassium: 4.3 mmol/L (ref 3.5–5.2)
Sodium: 139 mmol/L (ref 134–144)
Total Protein: 7.5 g/dL (ref 6.0–8.5)
eGFR: 67 mL/min/{1.73_m2} (ref >59)
